# Patient Record
Sex: Female | Born: 2000 | Race: Black or African American | Hispanic: No | Marital: Single | Smoking: Never smoker
Health system: Southern US, Community
[De-identification: ages and names within clinical notes are randomized; demographics above are authoritative.]

## PROBLEM LIST (undated history)

## (undated) ENCOUNTER — Inpatient Hospital Stay (HOSPITAL_COMMUNITY): Payer: Self-pay

## (undated) DIAGNOSIS — D649 Anemia, unspecified: Secondary | ICD-10-CM

## (undated) HISTORY — PX: ILEOSTOMY: SHX1783

## (undated) HISTORY — PX: ABDOMINAL SURGERY: SHX537

---
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
## 2019-01-04 ENCOUNTER — Encounter (HOSPITAL_COMMUNITY): Payer: Self-pay | Admitting: Emergency Medicine

## 2019-01-04 ENCOUNTER — Ambulatory Visit (HOSPITAL_COMMUNITY)
Admission: EM | Admit: 2019-01-04 | Discharge: 2019-01-04 | Disposition: A | Discharge status: Home / Self Care | Payer: Medicaid Other | Attending: Urgent Care | Admitting: Urgent Care

## 2019-01-04 ENCOUNTER — Other Ambulatory Visit: Payer: Self-pay

## 2019-01-04 DIAGNOSIS — R110 Nausea: Secondary | ICD-10-CM | POA: Diagnosis present

## 2019-01-04 DIAGNOSIS — K5900 Constipation, unspecified: Secondary | ICD-10-CM

## 2019-01-04 DIAGNOSIS — R1084 Generalized abdominal pain: Secondary | ICD-10-CM | POA: Insufficient documentation

## 2019-01-04 DIAGNOSIS — D649 Anemia, unspecified: Secondary | ICD-10-CM | POA: Diagnosis present

## 2019-01-04 DIAGNOSIS — K0889 Other specified disorders of teeth and supporting structures: Secondary | ICD-10-CM | POA: Diagnosis present

## 2019-01-04 DIAGNOSIS — N939 Abnormal uterine and vaginal bleeding, unspecified: Secondary | ICD-10-CM | POA: Insufficient documentation

## 2019-01-04 LAB — CBC WITH DIFFERENTIAL/PLATELET
Abs Immature Granulocytes: 0.01 10*3/uL (ref 0.00–0.07)
Basophils Absolute: 0 10*3/uL (ref 0.0–0.1)
Basophils Relative: 1 %
Eosinophils Absolute: 0.1 10*3/uL (ref 0.0–0.5)
Eosinophils Relative: 2 %
HCT: 30.4 % — ABNORMAL LOW (ref 36.0–46.0)
Hemoglobin: 8.9 g/dL — ABNORMAL LOW (ref 12.0–15.0)
Immature Granulocytes: 0 %
Lymphocytes Relative: 36 %
Lymphs Abs: 2.6 10*3/uL (ref 0.7–4.0)
MCH: 24.5 pg — ABNORMAL LOW (ref 26.0–34.0)
MCHC: 29.3 g/dL — ABNORMAL LOW (ref 30.0–36.0)
MCV: 83.7 fL (ref 80.0–100.0)
Monocytes Absolute: 0.4 10*3/uL (ref 0.1–1.0)
Monocytes Relative: 6 %
Neutro Abs: 4 10*3/uL (ref 1.7–7.7)
Neutrophils Relative %: 55 %
Platelets: 309 10*3/uL (ref 150–400)
RBC: 3.63 MIL/uL — ABNORMAL LOW (ref 3.87–5.11)
RDW: 19.8 % — ABNORMAL HIGH (ref 11.5–15.5)
WBC: 7.2 10*3/uL (ref 4.0–10.5)
nRBC: 0 % (ref 0.0–0.2)

## 2019-01-04 LAB — POCT URINALYSIS DIP (DEVICE)
Glucose, UA: NEGATIVE mg/dL
Ketones, ur: NEGATIVE mg/dL
Nitrite: NEGATIVE
Protein, ur: 100 mg/dL — AB
Specific Gravity, Urine: 1.025 (ref 1.005–1.030)
Urobilinogen, UA: 1 mg/dL (ref 0.0–1.0)
pH: 6 (ref 5.0–8.0)

## 2019-01-04 LAB — POCT PREGNANCY, URINE: Preg Test, Ur: NEGATIVE

## 2019-01-04 MED ORDER — LIDOCAINE VISCOUS HCL 2 % MT SOLN: OROMUCOSAL | 0 refills | Status: DC

## 2019-01-04 MED ORDER — ONDANSETRON 4 MG PO TBDP
8.0000 mg | ORAL_TABLET | Freq: Once | ORAL | Status: AC
  Administered 2019-01-04: 8 mg via ORAL

## 2019-01-04 MED ORDER — ACETAMINOPHEN 325 MG PO TABS
ORAL_TABLET | ORAL | Status: AC
  Filled 2019-01-04: qty 2

## 2019-01-04 MED ORDER — ACETAMINOPHEN 325 MG PO TABS
650.0000 mg | ORAL_TABLET | Freq: Once | ORAL | Status: AC
  Administered 2019-01-04: 650 mg via ORAL

## 2019-01-04 MED ORDER — ONDANSETRON 4 MG PO TBDP
ORAL_TABLET | ORAL | Status: AC
  Filled 2019-01-04: qty 2

## 2019-01-04 MED ORDER — MEGESTROL ACETATE 40 MG PO TABS: 80.0000 mg | ORAL_TABLET | Freq: Every day | ORAL | 0 refills | Status: DC

## 2019-01-04 MED ORDER — ONDANSETRON 8 MG PO TBDP: 8.0000 mg | ORAL_TABLET | Freq: Three times a day (TID) | ORAL | 0 refills | Status: DC | PRN

## 2019-01-04 MED ORDER — DOCUSATE SODIUM 50 MG PO CAPS: 50.0000 mg | ORAL_CAPSULE | Freq: Two times a day (BID) | ORAL | 5 refills | Status: DC

## 2019-01-04 NOTE — ED Provider Notes (Addendum)
MRN: [id number] DOB: Dec 25, 2000  Subjective:   Sonya Stevens is a 18 y.o. female presenting for 2-week history of persistent nausea, 2 bouts of loose stools, intermittent moderate aching mid to lower abdominal pain.  Patient also reports having dental pain, altered sense of taste.  She has had some intermittent dizziness as well.  Of note, [facility] and found to have severe anemia with hemoglobin and hematocrit of 8.2 and 27.2%.  She had normal platelets and WBC.  Was treated with Keflex for cystitis, urine culture was equivocal.  Patient stopped taking Keflex a few days afterwards since it was not helping her symptoms.  Today she reports she has had ongoing vaginal bleeding for the past 10 days.  She has also been taking iron supplements as advised.    No Known Allergies  Past medical history of undifferentiated anemia.  History reviewed. No pertinent surgical history.   Review of Systems  Constitutional: Positive for malaise/fatigue. Negative for fever.  HENT: Negative for congestion, ear pain, sinus pain and sore throat.        Dental pain.  Eyes: Negative for blurred vision, double vision, discharge and redness.  Respiratory: Negative for cough, hemoptysis, shortness of breath and wheezing.   Cardiovascular: Negative for chest pain.  Gastrointestinal: Positive for abdominal pain, diarrhea and nausea. Negative for blood in stool and vomiting.  Genitourinary: Negative for dysuria, flank pain and hematuria.  Musculoskeletal: Negative for myalgias.  Skin: Negative for rash.  Neurological: Positive for dizziness. Negative for weakness and headaches.  Psychiatric/Behavioral: Negative for depression and substance abuse.    Objective:   Vitals: BP 103/83   Pulse 88   Temp 98.2 F (36.8 C) (Oral)   Resp 16   LMP 12/26/2018   SpO2 100%   Physical Exam Constitutional:      General: She is not in acute distress.    Appearance:  Normal appearance. She is well-developed and normal weight. She is ill-appearing. She is not toxic-appearing or diaphoretic.  HENT:     Head: Normocephalic and atraumatic.     Right Ear: Tympanic membrane, ear canal and external ear normal. No drainage or tenderness. No middle ear effusion. Tympanic membrane is not erythematous.     Left Ear: Tympanic membrane, ear canal and external ear normal. No drainage or tenderness.  No middle ear effusion. Tympanic membrane is not erythematous.     Nose: Nose normal. No congestion or rhinorrhea.     Mouth/Throat:     Mouth: Mucous membranes are moist. No oral lesions.     Pharynx: Oropharynx is clear. No pharyngeal swelling, oropharyngeal exudate, posterior oropharyngeal erythema or uvula swelling.     Tonsils: No tonsillar exudate or tonsillar abscesses.  Eyes:     General: No scleral icterus.    Extraocular Movements: Extraocular movements intact.     Right eye: Normal extraocular motion.     Left eye: Normal extraocular motion.     Conjunctiva/sclera: Conjunctivae normal.     Pupils: Pupils are equal, round, and reactive to light.  Neck:     Musculoskeletal: Normal range of motion and neck supple.  Cardiovascular:     Rate and Rhythm: Normal rate and regular rhythm.     Pulses: Normal pulses.     Heart sounds: Normal heart sounds. No murmur. No friction rub. No gallop.   Pulmonary:     Effort: Pulmonary effort is normal. No respiratory distress.     Breath sounds:  Normal breath sounds. No stridor. No wheezing, rhonchi or rales.  Abdominal:     General: Bowel sounds are normal. There is no distension.     Palpations: Abdomen is soft. There is no mass.     Tenderness: There is generalized abdominal tenderness and tenderness in the right lower quadrant, suprapubic area and left lower quadrant. There is no right CVA tenderness, left CVA tenderness, guarding or rebound. Negative signs include Murphy's sign, Rovsing's sign and McBurney's sign.   Lymphadenopathy:     Cervical: No cervical adenopathy.  Skin:    General: Skin is warm and dry.     Coloration: Skin is not pale.     Findings: No rash.  Neurological:     General: No focal deficit present.     Mental Status: She is alert and oriented to person, place, and time.  Psychiatric:        Mood and Affect: Mood normal.        Behavior: Behavior normal.        Thought Content: Thought content normal.        Judgment: Judgment normal.     Results for orders placed or performed during the hospital encounter of 01/04/19 (from the past 24 hour(s))  CBC with Differential     Status: Abnormal   Collection Time: 01/04/19 10:33 AM  Result Value Ref Range   WBC 7.2 4.0 - 10.5 K/uL   RBC 3.63 (L) 3.87 - 5.11 MIL/uL   Hemoglobin 8.9 (L) 12.0 - 15.0 g/dL   HCT 30.4 (L) 36.0 - 46.0 %   MCV 83.7 80.0 - 100.0 fL   MCH 24.5 (L) 26.0 - 34.0 pg   MCHC 29.3 (L) 30.0 - 36.0 g/dL   RDW 19.8 (H) 11.5 - 15.5 %   Platelets 309 150 - 400 K/uL   nRBC 0.0 0.0 - 0.2 %   Neutrophils Relative % 55 %   Neutro Abs 4.0 1.7 - 7.7 K/uL   Lymphocytes Relative 36 %   Lymphs Abs 2.6 0.7 - 4.0 K/uL   Monocytes Relative 6 %   Monocytes Absolute 0.4 0.1 - 1.0 K/uL   Eosinophils Relative 2 %   Eosinophils Absolute 0.1 0.0 - 0.5 K/uL   Basophils Relative 1 %   Basophils Absolute 0.0 0.0 - 0.1 K/uL   Immature Granulocytes 0 %   Abs Immature Granulocytes 0.01 0.00 - 0.07 K/uL  POCT urinalysis dip (device)     Status: Abnormal   Collection Time: 01/04/19 10:55 AM  Result Value Ref Range   Glucose, UA NEGATIVE NEGATIVE mg/dL   Bilirubin Urine SMALL (A) NEGATIVE   Ketones, ur NEGATIVE NEGATIVE mg/dL   Specific Gravity, Urine 1.025 1.005 - 1.030   Hgb urine dipstick LARGE (A) NEGATIVE   pH 6.0 5.0 - 8.0   Protein, ur 100 (A) NEGATIVE mg/dL   Urobilinogen, UA 1.0 0.0 - 1.0 mg/dL   Nitrite NEGATIVE NEGATIVE   Leukocytes,Ua TRACE (A) NEGATIVE  Pregnancy, urine POC     Status: None   Collection Time:  01/04/19 11:05 AM  Result Value Ref Range   Preg Test, Ur NEGATIVE NEGATIVE    Assessment and Plan :   1. Abnormal vaginal bleeding   2. Generalized abdominal pain   3. Nausea   4. Anemia, unspecified type   5. Pain, dental   6. Constipation, unspecified constipation type      Zofran and Tylenol given orally while in clinic.  We will have patient start  Colace to help with constipation which I suspect is due to her iron supplementation.  Will use Zofran and lidocaine for supportive care of dental pain and nausea.  Can have patient start Megace for 5 days to help with abnormal vaginal bleeding.  Emphasized need to establish care with a gynecologist and a PCP.  Patient also needs consult with dentist which she is agreeable to trying to set up. Counseled patient on potential for adverse effects with medications prescribed/recommended today, ER and return-to-clinic precautions discussed, patient verbalized understanding.    Wallis Bamberg, PA-C 01/04/19 1224    Wallis Bamberg, PA-C 01/04/19 1225

## 2019-01-04 NOTE — ED Triage Notes (Signed)
PT reports lower abdominal pain with nausea and intermittent diarrhea for 2 weeks.

## 2019-01-04 NOTE — ED Triage Notes (Signed)
Dental pain for 2 weeks as well

## 2019-01-04 NOTE — ED Triage Notes (Signed)
PT has been bleeding heavily since the 17th and has history of anemia. Reports lightheadedness with position changes

## 2019-02-14 ENCOUNTER — Ambulatory Visit (HOSPITAL_COMMUNITY)
Admission: EM | Admit: 2019-02-14 | Discharge: 2019-02-14 | Disposition: A | Discharge status: Home / Self Care | Payer: Medicaid Other | Attending: Urgent Care | Admitting: Urgent Care

## 2019-02-14 ENCOUNTER — Encounter (HOSPITAL_COMMUNITY): Payer: Self-pay

## 2019-02-14 DIAGNOSIS — Z3202 Encounter for pregnancy test, result negative: Secondary | ICD-10-CM

## 2019-02-14 DIAGNOSIS — Z20828 Contact with and (suspected) exposure to other viral communicable diseases: Secondary | ICD-10-CM | POA: Insufficient documentation

## 2019-02-14 DIAGNOSIS — R1084 Generalized abdominal pain: Secondary | ICD-10-CM | POA: Diagnosis present

## 2019-02-14 LAB — POCT URINALYSIS DIP (DEVICE)
Bilirubin Urine: NEGATIVE
Glucose, UA: NEGATIVE mg/dL
Hgb urine dipstick: NEGATIVE
Ketones, ur: NEGATIVE mg/dL
Leukocytes,Ua: NEGATIVE
Nitrite: NEGATIVE
Protein, ur: NEGATIVE mg/dL
Specific Gravity, Urine: 1.025 (ref 1.005–1.030)
Urobilinogen, UA: 1 mg/dL (ref 0.0–1.0)
pH: 7 (ref 5.0–8.0)

## 2019-02-14 LAB — POCT PREGNANCY, URINE: Preg Test, Ur: NEGATIVE

## 2019-02-14 NOTE — Discharge Instructions (Addendum)
Your urine pregnancy test was negative.  Your urine does not show signs of infection.    Your COVID test is pending.  You should self quarantine until your COVID test is back.    Go to the emergency department if you have acute abdominal pain, worsening symptoms, or new symptoms such as fever, vomiting, diarrhea, or other concerns.

## 2019-02-14 NOTE — ED Triage Notes (Signed)
Pt report having back pain, right flank pain, abdominal pain headache and nausea x 1 days.

## 2019-02-14 NOTE — ED Provider Notes (Signed)
[place]    CSN: [id number] Arrival date & time: 02/14/19  1622      History   Chief Complaint Chief Complaint  Patient presents with  . Back Pain  . Flank Pain  . Abdominal Pain  . Headache  . Nausea    HPI Sonya Stevens is a 18 y.o. female.   Patient presents with right flank pain, back pain, dysuria, lower abdominal pain, headache, nausea since yesterday.  She denies fever, vomiting, diarrhea, constipation, vaginal discharge, pelvic pain, or other symptoms.  LMP: 01/24/2019.  The history is provided by the patient.    History reviewed. No pertinent past medical history.  There are no active problems to display for this patient.   History reviewed. No pertinent surgical history.  OB History   No obstetric history on file.      Home Medications    Prior to Admission medications   Medication Sig Start Date End Date Taking? Authorizing Provider  cephALEXin (KEFLEX) 500 MG capsule Take 1 capsule (500 mg total) by mouth 4 (four) times daily. 12/07/18   Muthersbaugh, Jarrett Soho, PA-C  docusate sodium (COLACE) 50 MG capsule Take 1 capsule (50 mg total) by mouth 2 (two) times daily. 01/04/19   Jaynee Eagles, PA-C  ferrous sulfate 325 (65 FE) MG tablet Take 1 tablet (325 mg total) by mouth daily. 12/07/18   Muthersbaugh, Jarrett Soho, PA-C  lidocaine (XYLOCAINE) 2 % solution Use 15 female oral rinse 3 times daily as needed for dental pain.  Do not swallow medication. 01/04/19   Jaynee Eagles, PA-C  megestrol (MEGACE) 40 MG tablet Take 2 tablets (80 mg total) by mouth daily. 01/04/19   Jaynee Eagles, PA-C  ondansetron (ZOFRAN-ODT) 8 MG disintegrating tablet Take 1 tablet (8 mg total) by mouth every 8 (eight) hours as needed for nausea or vomiting. 01/04/19   Jaynee Eagles, PA-C    Family History Family History  Problem Relation Age of Onset  . Healthy Mother   . Healthy Father     Social History Social History   Tobacco Use  . Smoking status: Passive Smoke Exposure -  Never Smoker  . Smokeless tobacco: Never Used  Substance Use Topics  . Alcohol use: Not Currently  . Drug use: Yes    Types: Marijuana     Allergies   Patient has no known allergies.   Review of Systems Review of Systems  Constitutional: Negative for chills and fever.  HENT: Negative for congestion, ear pain, rhinorrhea and sore throat.   Eyes: Negative for pain and visual disturbance.  Respiratory: Negative for cough and shortness of breath.   Cardiovascular: Negative for chest pain and palpitations.  Gastrointestinal: Positive for abdominal pain. Negative for vomiting.  Genitourinary: Positive for dysuria and flank pain. Negative for hematuria, pelvic pain and vaginal discharge.  Musculoskeletal: Positive for back pain. Negative for arthralgias.  Skin: Negative for color change and rash.  Neurological: Positive for headaches. Negative for seizures and syncope.  All other systems reviewed and are negative.    Physical Exam Triage Vital Signs ED Triage Vitals  Enc Vitals Group     BP 02/14/19 1654 (!) 104/55     Pulse Rate 02/14/19 1654 (!) 119     Resp 02/14/19 1654 15     Temp 02/14/19 1654 98.9 F (37.2 C)     Temp Source 02/14/19 1654 Oral     SpO2 02/14/19 1654 99 %     Weight --      Height --  Head Circumference --      Peak Flow --      Pain Score 02/14/19 1652 8     Pain Loc --      Pain Edu? --      Excl. in GC? --    No data found.  Updated Vital Signs BP (!) 104/55 (BP Location: Right Arm)   Pulse (!) 119   Temp 98.9 F (37.2 C) (Oral)   Resp 15   SpO2 99%   Visual Acuity Right Eye Distance:   Left Eye Distance:   Bilateral Distance:    Right Eye Near:   Left Eye Near:    Bilateral Near:     Physical Exam Vitals signs and nursing note reviewed.  Constitutional:      General: She is not in acute distress.    Appearance: She is well-developed. She is not ill-appearing.  HENT:     Head: Normocephalic and atraumatic.     Right  Ear: Tympanic membrane normal.     Left Ear: Tympanic membrane normal.     Nose: Nose normal.     Mouth/Throat:     Mouth: Mucous membranes are moist.     Pharynx: Oropharynx is clear.  Eyes:     Conjunctiva/sclera: Conjunctivae normal.  Neck:     Musculoskeletal: Neck supple.  Cardiovascular:     Rate and Rhythm: Normal rate and regular rhythm.     Heart sounds: No murmur.  Pulmonary:     Effort: Pulmonary effort is normal. No respiratory distress.     Breath sounds: Normal breath sounds.  Abdominal:     General: Bowel sounds are normal.     Palpations: Abdomen is soft.     Tenderness: There is generalized abdominal tenderness. There is no right CVA tenderness, left CVA tenderness, guarding or rebound.  Skin:    General: Skin is warm and dry.     Findings: No rash.  Neurological:     Mental Status: She is alert.      UC Treatments / Results  Labs (all labs ordered are listed, but only abnormal results are displayed) Labs Reviewed  POCT URINALYSIS DIP (DEVICE)  POCT PREGNANCY, URINE    EKG   Radiology No results found.  Procedures Procedures (including critical care time)  Medications Ordered in UC Medications - No data to display  Initial Impression / Assessment and Plan / UC Course  I have reviewed the triage vital signs and the nursing notes.  Pertinent labs & imaging results that were available during my care of the patient were reviewed by me and considered in my medical decision making (see chart for details).   Abdominal pain, generalized.  Urine pregnancy negative.  Urine dip normal.  COVID test performed here at patient's request; instructed her to self quarantine until her test result is back.  Discussed that she should go to the emergency department if she has acute abdominal pain, has worsening symptoms, or develops new symptoms such as fever, vomiting, diarrhea, or other concerns.  Patient agrees to plan of care.     Final Clinical Impressions(s)  / UC Diagnoses   Final diagnoses:  Generalized abdominal pain     Discharge Instructions     Your urine pregnancy test was negative.  Your urine does not show signs of infection.    Your COVID test is pending.  You should self quarantine until your COVID test is back.    Go to the emergency department if you have  acute abdominal pain, worsening symptoms, or new symptoms such as fever, vomiting, diarrhea, or other concerns.        ED Prescriptions    None     PDMP not reviewed this encounter.   Mickie Bailate, Jacari Kirsten H, NP 02/14/19 1710

## 2019-02-15 LAB — NOVEL CORONAVIRUS, NAA (HOSP ORDER, SEND-OUT TO REF LAB; TAT 18-24 HRS): SARS-CoV-2, NAA: NOT DETECTED

## 2019-02-27 ENCOUNTER — Ambulatory Visit (HOSPITAL_COMMUNITY)
Admission: EM | Admit: 2019-02-27 | Discharge: 2019-02-27 | Disposition: A | Discharge status: Home / Self Care | Payer: Medicaid Other | Attending: Family Medicine | Admitting: Family Medicine

## 2019-02-27 ENCOUNTER — Encounter (HOSPITAL_COMMUNITY): Payer: Self-pay

## 2019-02-27 ENCOUNTER — Other Ambulatory Visit: Payer: Self-pay

## 2019-02-27 DIAGNOSIS — N390 Urinary tract infection, site not specified: Secondary | ICD-10-CM | POA: Insufficient documentation

## 2019-02-27 DIAGNOSIS — Z3202 Encounter for pregnancy test, result negative: Secondary | ICD-10-CM

## 2019-02-27 DIAGNOSIS — Z202 Contact with and (suspected) exposure to infections with a predominantly sexual mode of transmission: Secondary | ICD-10-CM | POA: Insufficient documentation

## 2019-02-27 DIAGNOSIS — B9689 Other specified bacterial agents as the cause of diseases classified elsewhere: Secondary | ICD-10-CM | POA: Diagnosis not present

## 2019-02-27 LAB — POCT URINALYSIS DIP (DEVICE)
Glucose, UA: 100 mg/dL — AB
Ketones, ur: 15 mg/dL — AB
Nitrite: POSITIVE — AB
Protein, ur: >=300 mg/dL — AB
Specific Gravity, Urine: 1.015 (ref 1.005–1.030)
Urobilinogen, UA: 2 mg/dL — ABNORMAL HIGH (ref 0.0–1.0)
pH: 5 (ref 5.0–8.0)

## 2019-02-27 LAB — POCT PREGNANCY, URINE: Preg Test, Ur: NEGATIVE

## 2019-02-27 MED ORDER — CEFTRIAXONE SODIUM 250 MG IJ SOLR
250.0000 mg | Freq: Once | INTRAMUSCULAR | Status: AC
  Administered 2019-02-27: 10:00:00 250 mg via INTRAMUSCULAR

## 2019-02-27 MED ORDER — SULFAMETHOXAZOLE-TRIMETHOPRIM 800-160 MG PO TABS: 1.0000 | ORAL_TABLET | Freq: Two times a day (BID) | ORAL | 0 refills | Status: AC

## 2019-02-27 MED ORDER — CEFTRIAXONE SODIUM 250 MG IJ SOLR
INTRAMUSCULAR | Status: AC
  Filled 2019-02-27: qty 250

## 2019-02-27 MED ORDER — AZITHROMYCIN 250 MG PO TABS
1000.0000 mg | ORAL_TABLET | Freq: Once | ORAL | Status: AC
  Administered 2019-02-27: 1000 mg via ORAL

## 2019-02-27 MED ORDER — LIDOCAINE HCL (PF) 1 % IJ SOLN
INTRAMUSCULAR | Status: AC
  Filled 2019-02-27: qty 2

## 2019-02-27 MED ORDER — AZITHROMYCIN 250 MG PO TABS
ORAL_TABLET | ORAL | Status: AC
  Filled 2019-02-27: qty 1

## 2019-02-27 NOTE — ED Provider Notes (Signed)
[facility]    CSN: [id number] Arrival date & time: 02/27/19  0847      History   Chief Complaint Chief Complaint  Patient presents with  . Exposure to STD  . Abdominal Pain  . Back Pain    HPI Sonya Stevens is a 18 y.o. female.   Patient is a 18 year old female that presents today with mild lower abdominal cramping, back pain x2 weeks.  She has had some mild spotting for 3 days.  Concern for STD due to sexual partner being diagnosed with chlamydia.  Denies any vaginal discharge, itching or irritation.  She has had some hematuria and mild dysuria.  History of UTI.  No fevers, chills, body aches, nausea or vomiting.  No flank pain.  She has not taken any medication for her symptoms.  ROS per HPI      History reviewed. No pertinent past medical history.  There are no active problems to display for this patient.   History reviewed. No pertinent surgical history.  OB History   No obstetric history on file.      Home Medications    Prior to Admission medications   Medication Sig Start Date End Date Taking? Authorizing Provider  docusate sodium (COLACE) 50 MG capsule Take 1 capsule (50 mg total) by mouth 2 (two) times daily. 01/04/19   Jaynee Eagles, PA-C  ferrous sulfate 325 (65 FE) MG tablet Take 1 tablet (325 mg total) by mouth daily. 12/07/18   Muthersbaugh, Jarrett Soho, PA-C  lidocaine (XYLOCAINE) 2 % solution Use 15 female oral rinse 3 times daily as needed for dental pain.  Do not swallow medication. 01/04/19   Jaynee Eagles, PA-C  megestrol (MEGACE) 40 MG tablet Take 2 tablets (80 mg total) by mouth daily. 01/04/19   Jaynee Eagles, PA-C  ondansetron (ZOFRAN-ODT) 8 MG disintegrating tablet Take 1 tablet (8 mg total) by mouth every 8 (eight) hours as needed for nausea or vomiting. 01/04/19   Jaynee Eagles, PA-C  sulfamethoxazole-trimethoprim (BACTRIM DS) 800-160 MG tablet Take 1 tablet by mouth 2 (two) times daily for 7 days. 02/27/19 03/06/19  Orvan July, NP     Family History Family History  Problem Relation Age of Onset  . Healthy Mother   . Healthy Father     Social History Social History   Tobacco Use  . Smoking status: Passive Smoke Exposure - Never Smoker  . Smokeless tobacco: Never Used  Substance Use Topics  . Alcohol use: Not Currently  . Drug use: Yes    Types: Marijuana     Allergies   Patient has no known allergies.   Review of Systems Review of Systems   Physical Exam Triage Vital Signs ED Triage Vitals  Enc Vitals Group     BP 02/27/19 0904 98/65     Pulse Rate 02/27/19 0904 84     Resp 02/27/19 0904 15     Temp 02/27/19 0904 98.5 F (36.9 C)     Temp Source 02/27/19 0904 Oral     SpO2 02/27/19 0904 100 %     Weight --      Height --      Head Circumference --      Peak Flow --      Pain Score 02/27/19 0902 8     Pain Loc --      Pain Edu? --      Excl. in [place]? --    No data found.  Updated Vital Signs BP 98/65 (  BP Location: Left Arm)   Pulse 84   Temp 98.5 F (36.9 C) (Oral)   Resp 15   SpO2 100%   Visual Acuity Right Eye Distance:   Left Eye Distance:   Bilateral Distance:    Right Eye Near:   Left Eye Near:    Bilateral Near:     Physical Exam Vitals signs and nursing note reviewed.  Constitutional:      General: She is not in acute distress.    Appearance: Normal appearance. She is not ill-appearing, toxic-appearing or diaphoretic.  HENT:     Head: Normocephalic.     Nose: Nose normal.     Mouth/Throat:     Pharynx: Oropharynx is clear.  Eyes:     Conjunctiva/sclera: Conjunctivae normal.  Neck:     Musculoskeletal: Normal range of motion.  Pulmonary:     Effort: Pulmonary effort is normal.  Abdominal:     Palpations: Abdomen is soft.     Tenderness: There is no abdominal tenderness.  Musculoskeletal: Normal range of motion.  Skin:    General: Skin is warm and dry.     Findings: No rash.  Neurological:     Mental Status: She is alert.  Psychiatric:        Mood and  Affect: Mood normal.      UC Treatments / Results  Labs (all labs ordered are listed, but only abnormal results are displayed) Labs Reviewed  POCT URINALYSIS DIP (DEVICE) - Abnormal; Notable for the following components:      Result Value   Glucose, UA 100 (*)    Bilirubin Urine LARGE (*)    Ketones, ur 15 (*)    Hgb urine dipstick LARGE (*)    Protein, ur >=300 (*)    Urobilinogen, UA 2.0 (*)    Nitrite POSITIVE (*)    Leukocytes,Ua LARGE (*)    All other components within normal limits  URINE CULTURE  POC URINE PREG, ED  POCT PREGNANCY, URINE  CERVICOVAGINAL ANCILLARY ONLY    EKG   Radiology No results found.  Procedures Procedures (including critical care time)  Medications Ordered in UC Medications  cefTRIAXone (ROCEPHIN) injection 250 mg (250 mg Intramuscular Given 02/27/19 0940)  azithromycin (ZITHROMAX) tablet 1,000 mg (1,000 mg Oral Given 02/27/19 0939)  azithromycin (ZITHROMAX) 250 MG tablet (has no administration in time range)  cefTRIAXone (ROCEPHIN) 250 MG injection (has no administration in time range)  lidocaine (PF) (XYLOCAINE) 1 % injection (has no administration in time range)    Initial Impression / Assessment and Plan / UC Course  I have reviewed the triage vital signs and the nursing notes.  Pertinent labs & imaging results that were available during my care of the patient were reviewed by me and considered in my medical decision making (see chart for details).     Exposure to STD-treating prophylactically for gonorrhea chlamydia today based on exposure.  UTI-patient positive for UTI.  Sending urine for culture.  Will treat with Bactrim Recommended pushing fluids for dehydration Return precautions given Labs pending.  Follow up as needed for continued or worsening symptoms   Final Clinical Impressions(s) / UC Diagnoses   Final diagnoses:  Lower urinary tract infectious disease  Exposure to STD     Discharge Instructions      Treat you for a urinary tract infection. Also treated you for gonorrhea and chlamydia today here in clinic. We will send her swab and urine for culture and call you with any positive  results. You need to increase your fluid intake.  You appear dehydrated on your urine. If your symptoms worsen you need to go to follow up     ED Prescriptions    Medication Sig Dispense Auth. Provider   sulfamethoxazole-trimethoprim (BACTRIM DS) 800-160 MG tablet Take 1 tablet by mouth 2 (two) times daily for 7 days. 14 tablet Rahmel Nedved A, NP     PDMP not reviewed this encounter.   Dahlia Byes A, NP 02/27/19 1002

## 2019-02-27 NOTE — ED Triage Notes (Signed)
Pt states she is having lower abdominal pain and back pain x 13 days. Pt reports she is spotting x 3 days.   Pt states her sexual partner was diagnosed with Chlamydia.Pt wants a STD test.

## 2019-02-27 NOTE — Discharge Instructions (Addendum)
Treat you for a urinary tract infection. Also treated you for gonorrhea and chlamydia today here in clinic. We will send her swab and urine for culture and call you with any positive results. You need to increase your fluid intake.  You appear dehydrated on your urine. If your symptoms worsen you need to go to follow up

## 2019-02-28 LAB — URINE CULTURE: Culture: NO GROWTH

## 2019-03-01 LAB — CERVICOVAGINAL ANCILLARY ONLY
Bacterial vaginitis: POSITIVE — AB
Candida vaginitis: NEGATIVE
Chlamydia: POSITIVE — AB
Neisseria Gonorrhea: NEGATIVE
Trichomonas: NEGATIVE

## 2019-03-02 ENCOUNTER — Encounter (HOSPITAL_COMMUNITY): Payer: Self-pay

## 2019-03-02 ENCOUNTER — Telehealth (HOSPITAL_COMMUNITY): Payer: Self-pay | Admitting: Emergency Medicine

## 2019-03-02 MED ORDER — METRONIDAZOLE 500 MG PO TABS: 500.0000 mg | ORAL_TABLET | Freq: Two times a day (BID) | ORAL | 0 refills | Status: AC

## 2019-03-02 NOTE — Telephone Encounter (Signed)
Chlamydia is positive.  This was treated at the urgent care visit with po zithromax 1g.  Pt needs education to please refrain from sexual intercourse for 7 days to give the medicine time to work.  Sexual partners need to be notified and tested/treated.  Condoms may reduce risk of reinfection.  Recheck or followup with PCP for further evaluation if symptoms are not improving.  [facility] notified.  Bacterial vaginosis is positive. This was not treated at the urgent care visit.  Flagyl 500 mg BID x 7 days #14 no refills sent to patients pharmacy of choice.    Attempted to reach patient. No answer at this time. Call cannot be completed.  Mychart message sent.

## 2019-03-03 ENCOUNTER — Telehealth (HOSPITAL_COMMUNITY): Payer: Self-pay | Admitting: Emergency Medicine

## 2019-03-03 NOTE — Telephone Encounter (Signed)
Attempted to reach patient x2. No answer at this time. Call cannot be completed.   

## 2019-03-06 ENCOUNTER — Telehealth (HOSPITAL_COMMUNITY): Payer: Self-pay | Admitting: Emergency Medicine

## 2019-03-06 NOTE — Telephone Encounter (Signed)
Attempted to reach patient x3. No answer at this time.call cannot be completed. Letter sent

## 2019-03-22 ENCOUNTER — Ambulatory Visit (HOSPITAL_COMMUNITY)
Admission: EM | Admit: 2019-03-22 | Discharge: 2019-03-22 | Disposition: A | Discharge status: Home / Self Care | Payer: Medicaid Other | Attending: Family Medicine | Admitting: Family Medicine

## 2019-03-22 ENCOUNTER — Encounter (HOSPITAL_COMMUNITY): Payer: Self-pay

## 2019-03-22 ENCOUNTER — Other Ambulatory Visit: Payer: Self-pay

## 2019-03-22 DIAGNOSIS — Z3202 Encounter for pregnancy test, result negative: Secondary | ICD-10-CM

## 2019-03-22 DIAGNOSIS — Z711 Person with feared health complaint in whom no diagnosis is made: Secondary | ICD-10-CM | POA: Insufficient documentation

## 2019-03-22 DIAGNOSIS — N898 Other specified noninflammatory disorders of vagina: Secondary | ICD-10-CM | POA: Insufficient documentation

## 2019-03-22 LAB — POCT URINALYSIS DIP (DEVICE)
Bilirubin Urine: NEGATIVE
Glucose, UA: NEGATIVE mg/dL
Ketones, ur: NEGATIVE mg/dL
Leukocytes,Ua: NEGATIVE
Nitrite: NEGATIVE
Protein, ur: NEGATIVE mg/dL
Specific Gravity, Urine: 1.015 (ref 1.005–1.030)
Urobilinogen, UA: 0.2 mg/dL (ref 0.0–1.0)
pH: 7.5 (ref 5.0–8.0)

## 2019-03-22 LAB — POCT PREGNANCY, URINE: Preg Test, Ur: NEGATIVE

## 2019-03-22 MED ORDER — AZITHROMYCIN 250 MG PO TABS: 1000.0000 mg | ORAL_TABLET | Freq: Once | ORAL | 0 refills | Status: AC

## 2019-03-22 NOTE — Discharge Instructions (Signed)
Negative for pregnancy.  We will re-treat you for chlamydia, you will have to go to the pharmacy and get this medication, unfortunately.  Please ensure all your partners have been tested and treated.  Please withhold from intercourse for the next week. Please use condoms to prevent STD's.   Will notify you of any positive findings and if any changes to treatment are needed.

## 2019-03-22 NOTE — ED Triage Notes (Signed)
Patient presents to Urgent Care with complaints of needing STD testing since having unprotected sex recently. Patient reports she has been having some vaginal itching.

## 2019-03-22 NOTE — ED Provider Notes (Signed)
[facility]    CSN: [id number] Arrival date & time: 03/22/19  1143      History   Chief Complaint Chief Complaint  Patient presents with  . SEXUALLY TRANSMITTED DISEASE    HPI Sonya Stevens is a 18 y.o. female.   Sonya Stevens presents with complaints of vaginal discharge and itching which she noted a few days ago. She is concerned about chlamydia as this feels the same. She was just treated for this 10/18, but only one of her two partners got treated. She has not always used condoms with the second partner. Symptoms started after intercourse with that partner. No vaginal bleeding. LMP was last month, she does not know when, feels like it was lighter than normal for her. Discharge is yellow. Denies sores, lesions, rash. Denies urinary symptoms.      ROS per HPI, negative if not otherwise mentioned.      History reviewed. No pertinent past medical history.  There are no active problems to display for this patient.   History reviewed. No pertinent surgical history.  OB History   No obstetric history on file.      Home Medications    Prior to Admission medications   Medication Sig Start Date End Date Taking? Authorizing Provider  azithromycin (ZITHROMAX) 250 MG tablet Take 4 tablets (1,000 mg total) by mouth once for 1 dose. Take first 2 tablets together, then 1 every day until finished. 03/22/19 03/22/19  Zigmund Gottron, NP  docusate sodium (COLACE) 50 MG capsule Take 1 capsule (50 mg total) by mouth 2 (two) times daily. 01/04/19   Jaynee Eagles, PA-C  ferrous sulfate 325 (65 FE) MG tablet Take 1 tablet (325 mg total) by mouth daily. 12/07/18   Muthersbaugh, Jarrett Soho, PA-C  lidocaine (XYLOCAINE) 2 % solution Use 15 female oral rinse 3 times daily as needed for dental pain.  Do not swallow medication. 01/04/19   Jaynee Eagles, PA-C  megestrol (MEGACE) 40 MG tablet Take 2 tablets (80 mg total) by mouth daily. 01/04/19   Jaynee Eagles, PA-C  ondansetron  (ZOFRAN-ODT) 8 MG disintegrating tablet Take 1 tablet (8 mg total) by mouth every 8 (eight) hours as needed for nausea or vomiting. 01/04/19   Jaynee Eagles, PA-C    Family History Family History  Problem Relation Age of Onset  . Healthy Mother   . Healthy Father     Social History Social History   Tobacco Use  . Smoking status: Passive Smoke Exposure - Never Smoker  . Smokeless tobacco: Never Used  Substance Use Topics  . Alcohol use: Not Currently  . Drug use: Yes    Types: Marijuana     Allergies   Patient has no known allergies.   Review of Systems Review of Systems   Physical Exam Triage Vital Signs ED Triage Vitals  Enc Vitals Group     BP 03/22/19 1215 100/64     Pulse Rate 03/22/19 1215 82     Resp 03/22/19 1215 16     Temp 03/22/19 1215 98.6 F (37 C)     Temp Source 03/22/19 1215 Oral     SpO2 03/22/19 1215 100 %     Weight --      Height --      Head Circumference --      Peak Flow --      Pain Score 03/22/19 1213 0     Pain Loc --      Pain Edu? --  Excl. in [place]? --    No data found.  Updated Vital Signs BP 100/64 (BP Location: Right Arm)   Pulse 82   Temp 98.6 F (37 C) (Oral)   Resp 16   SpO2 100%    Physical Exam Constitutional:      General: She is not in acute distress.    Appearance: She is well-developed.  Cardiovascular:     Rate and Rhythm: Normal rate.  Pulmonary:     Effort: Pulmonary effort is normal.  Abdominal:     Palpations: Abdomen is soft. Abdomen is not rigid.     Tenderness: There is no abdominal tenderness. There is no guarding or rebound.  Genitourinary:    Comments: Denies sores, lesions, vaginal bleeding; no pelvic pain; gu exam deferred at this time, vaginal self swab collected.   Skin:    General: Skin is warm and dry.  Neurological:     Mental Status: She is alert and oriented to person, place, and time.      UC Treatments / Results  Labs (all labs ordered are listed, but only abnormal results  are displayed) Labs Reviewed  POCT URINALYSIS DIP (DEVICE) - Abnormal; Notable for the following components:      Result Value   Hgb urine dipstick TRACE (*)    All other components within normal limits  POC URINE PREG, ED  POCT PREGNANCY, URINE  CERVICOVAGINAL ANCILLARY ONLY    EKG   Radiology No results found.  Procedures Procedures (including critical care time)  Medications Ordered in UC Medications - No data to display  Initial Impression / Assessment and Plan / UC Course  I have reviewed the triage vital signs and the nursing notes.  Pertinent labs & imaging results that were available during my care of the patient were reviewed by me and considered in my medical decision making (see chart for details).     Concern that all of patient's partners were not treated and then patient had unprotected intercourse again, after she was treated for chlamydia. Patient endorses same symptoms. Empiric treatment with azithromycin provided pending vaginal cytology. Safe sex encouraged. Encouraged partners to get treated. Will notify of any positive findings and if any changes to treatment are needed.  Patient verbalized understanding and agreeable to plan.   Final Clinical Impressions(s) / UC Diagnoses   Final diagnoses:  Vaginal discharge  Concern about STD in female without diagnosis     Discharge Instructions     Negative for pregnancy.  We will re-treat you for chlamydia, you will have to go to the pharmacy and get this medication, unfortunately.  Please ensure all your partners have been tested and treated.  Please withhold from intercourse for the next week. Please use condoms to prevent STD's.   Will notify you of any positive findings and if any changes to treatment are needed.     ED Prescriptions    Medication Sig Dispense Auth. Provider   azithromycin (ZITHROMAX) 250 MG tablet Take 4 tablets (1,000 mg total) by mouth once for 1 dose. Take first 2 tablets  together, then 1 every day until finished. 4 tablet Georgetta Haber, NP     PDMP not reviewed this encounter.   Georgetta Haber, NP 03/22/19 1321

## 2019-03-24 ENCOUNTER — Telehealth: Payer: Self-pay | Admitting: Emergency Medicine

## 2019-03-24 LAB — CERVICOVAGINAL ANCILLARY ONLY
Bacterial vaginitis: POSITIVE — AB
Candida vaginitis: NEGATIVE
Chlamydia: NEGATIVE
Neisseria Gonorrhea: NEGATIVE
Trichomonas: NEGATIVE

## 2019-03-24 MED ORDER — METRONIDAZOLE 500 MG PO TABS: 500.0000 mg | ORAL_TABLET | Freq: Two times a day (BID) | ORAL | 0 refills | Status: AC

## 2019-03-24 NOTE — Telephone Encounter (Signed)
Bacterial vaginosis is positive. This was not treated at the urgent care visit.  Flagyl 500 mg BID x 7 days #14 no refills sent to patients pharmacy of choice.    Attempted to reach patient. No answer at this time. Call cannot be completed.   

## 2019-09-26 ENCOUNTER — Encounter (HOSPITAL_COMMUNITY): Payer: Self-pay

## 2019-09-26 ENCOUNTER — Ambulatory Visit (HOSPITAL_COMMUNITY)
Admission: EM | Admit: 2019-09-26 | Discharge: 2019-09-26 | Disposition: A | Discharge status: Home / Self Care | Payer: Medicaid Other | Attending: Physician Assistant | Admitting: Physician Assistant

## 2019-09-26 ENCOUNTER — Other Ambulatory Visit: Payer: Self-pay

## 2019-09-26 DIAGNOSIS — Z79899 Other long term (current) drug therapy: Secondary | ICD-10-CM | POA: Insufficient documentation

## 2019-09-26 DIAGNOSIS — Z20822 Contact with and (suspected) exposure to covid-19: Secondary | ICD-10-CM | POA: Diagnosis not present

## 2019-09-26 DIAGNOSIS — R1013 Epigastric pain: Secondary | ICD-10-CM | POA: Insufficient documentation

## 2019-09-26 DIAGNOSIS — N644 Mastodynia: Secondary | ICD-10-CM | POA: Insufficient documentation

## 2019-09-26 DIAGNOSIS — A084 Viral intestinal infection, unspecified: Secondary | ICD-10-CM | POA: Diagnosis present

## 2019-09-26 DIAGNOSIS — K219 Gastro-esophageal reflux disease without esophagitis: Secondary | ICD-10-CM | POA: Diagnosis not present

## 2019-09-26 LAB — POC URINE PREG, ED: Preg Test, Ur: NEGATIVE

## 2019-09-26 MED ORDER — FAMOTIDINE 20 MG PO TABS: 20.0000 mg | ORAL_TABLET | Freq: Two times a day (BID) | ORAL | 0 refills | Status: DC

## 2019-09-26 MED ORDER — ONDANSETRON HCL 4 MG PO TABS: 4.0000 mg | ORAL_TABLET | Freq: Three times a day (TID) | ORAL | 0 refills | Status: DC | PRN

## 2019-09-26 NOTE — ED Triage Notes (Signed)
Pt c/o N/V/Dx2 days. Pt vomited 3 times and had 2 bouts of diarrhea. Pt also stated breast are sorex1 mo.

## 2019-09-26 NOTE — ED Provider Notes (Signed)
[facility]    CSN: [id number] Arrival date & time: 09/26/19  1255      History   Chief Complaint Chief Complaint  Patient presents with  . Emesis    HPI Sonya Stevens is a 19 y.o. female.   Patient presents for evaluation of 2-day history of vomiting and loose stools.  She reports she has vomited 3 times over the last 2 days.  She denies vomiting today.  She denies nausea outside of the episodes of vomiting.  She reports she has had 2 episodes of loose watery stool over the last few days.  Denies blood or dark discoloration.  She reports some abdominal cramping when feeling the need to have a bowel movement.  She denies abdominal pain outside of this.  Denies fever, chills, headache, sore throat or other cold-like symptoms.  She reports history of having issues with acid reflux.  She reports she will eat and sometimes belch up fluid that is nasty tasting.  She endorses some heartburn symptoms after eating.  She has not been treated for this.  She reports also having breast tenderness over the last month.  She reports her last menstrual cycle was last month.  She is not on birth control.  Denies nipple discharge of any kind.         History reviewed. No pertinent past medical history.  There are no problems to display for this patient.   Past Surgical History:  Procedure Laterality Date  . ABDOMINAL SURGERY      OB History   No obstetric history on file.      Home Medications    Prior to Admission medications   Medication Sig Start Date End Date Taking? Authorizing Provider  docusate sodium (COLACE) 50 MG capsule Take 1 capsule (50 mg total) by mouth 2 (two) times daily. 01/04/19   Wallis Bamberg, PA-C  famotidine (PEPCID) 20 MG tablet Take 1 tablet (20 mg total) by mouth 2 (two) times daily. 09/26/19   Tiea Manninen, Veryl Speak, PA-C  ferrous sulfate 325 (65 FE) MG tablet Take 1 tablet (325 mg total) by mouth daily. 12/07/18   Muthersbaugh, Dahlia Client, PA-C  lidocaine  (XYLOCAINE) 2 % solution Use 15 female oral rinse 3 times daily as needed for dental pain.  Do not swallow medication. 01/04/19   Wallis Bamberg, PA-C  megestrol (MEGACE) 40 MG tablet Take 2 tablets (80 mg total) by mouth daily. 01/04/19   Wallis Bamberg, PA-C  ondansetron (ZOFRAN) 4 MG tablet Take 1 tablet (4 mg total) by mouth every 8 (eight) hours as needed for nausea or vomiting. 09/26/19   Anaka Beazer, Veryl Speak, PA-C  ondansetron (ZOFRAN-ODT) 8 MG disintegrating tablet Take 1 tablet (8 mg total) by mouth every 8 (eight) hours as needed for nausea or vomiting. 01/04/19   Wallis Bamberg, PA-C    Family History Family History  Problem Relation Age of Onset  . Healthy Mother   . Healthy Father     Social History Social History   Tobacco Use  . Smoking status: Never Smoker  . Smokeless tobacco: Never Used  Substance Use Topics  . Alcohol use: Not Currently  . Drug use: Yes    Types: Marijuana     Allergies   Patient has no known allergies.   Review of Systems Review of Systems   Physical Exam Triage Vital Signs ED Triage Vitals  Enc Vitals Group     BP 09/26/19 1418 109/65     Pulse Rate 09/26/19 1418  78     Resp 09/26/19 1418 16     Temp 09/26/19 1418 98.4 F (36.9 C)     Temp Source 09/26/19 1418 Oral     SpO2 09/26/19 1418 99 %     Weight 09/26/19 1419 115 lb (52.2 kg)     Height 09/26/19 1419 5\' 2"  (1.575 m)     Head Circumference --      Peak Flow --      Pain Score 09/26/19 1419 0     Pain Loc --      Pain Edu? --      Excl. in [place]? --    No data found.  Updated Vital Signs BP 109/65   Pulse 78   Temp 98.4 F (36.9 C) (Oral)   Resp 16   Ht 5\' 2"  (1.575 m)   Wt 115 lb (52.2 kg)   SpO2 99%   BMI 21.03 kg/m   Visual Acuity Right Eye Distance:   Left Eye Distance:   Bilateral Distance:    Right Eye Near:   Left Eye Near:    Bilateral Near:     Physical Exam Vitals and nursing note reviewed.  Constitutional:      General: She is not in acute distress.     Appearance: Normal appearance. She is well-developed. She is not ill-appearing.  HENT:     Head: Normocephalic and atraumatic.     Mouth/Throat:     Mouth: Mucous membranes are moist.     Pharynx: Oropharynx is clear.  Eyes:     Conjunctiva/sclera: Conjunctivae normal.  Cardiovascular:     Rate and Rhythm: Normal rate and regular rhythm.     Heart sounds: No murmur.  Pulmonary:     Effort: Pulmonary effort is normal. No respiratory distress.     Breath sounds: Normal breath sounds.  Abdominal:     Palpations: Abdomen is soft.     Tenderness: There is no abdominal tenderness. There is no right CVA tenderness or left CVA tenderness.  Musculoskeletal:     Cervical back: Neck supple.     Right lower leg: No edema.     Left lower leg: No edema.  Skin:    General: Skin is warm and dry.  Neurological:     Mental Status: She is alert.      UC Treatments / Results  Labs (all labs ordered are listed, but only abnormal results are displayed) Labs Reviewed  SARS CORONAVIRUS 2 (TAT 6-24 HRS)  POC URINE PREG, ED    EKG   Radiology No results found.  Procedures Procedures (including critical care time)  Medications Ordered in UC Medications - No data to display  Initial Impression / Assessment and Plan / UC Course  I have reviewed the triage vital signs and the nursing notes.  Pertinent labs & imaging results that were available during my care of the patient were reviewed by me and considered in my medical decision making (see chart for details).    #Viral gastroenteritis #Dyspepsia #Breast tenderness and female Patient is a 19 year old with symptoms consistent with a viral gastroenteritis.  She also has history of reported reflux issues.  She has normal vital signs in clinic and a benign abdominal exam.  Will give short course of Zofran as needed and recommend taking Pepcid twice daily.  Recommend increase p.o. liquid intake.  Deferred breast exam today and instructed to  follow-up with her primary care for her reflux symptoms and breast tenderness. -Urine pregnancy  was negative in clinic. -Covid PCR sent. -Follow-up and return precautions were discussed.  Patient verbalized understanding of plan of care Final Clinical Impressions(s) / UC Diagnoses   Final diagnoses:  Viral gastroenteritis  Dyspepsia  Breast tenderness in female     Discharge Instructions     Use the Zofran if you start to have more nausea and vomiting otherwise hold this  Take the Pepcid twice a day  Drink plenty of water and include Pedialyte.  Eat small meals as you become hungry  Please follow-up with your primary care for reevaluation of your suspected reflux symptoms and your breast tenderness.  If your symptoms acutely worsen such you have severe belly pain, worsening vomiting or high fever please return for evaluation  If your Covid-19 test is positive, you will receive a phone call from [facility] regarding your results. Negative test results are not called. Both positive and negative results area always visible on MyChart. If you do not have a MyChart account, sign up instructions are in your discharge papers.   Persons who are directed to care for themselves at home may discontinue isolation under the following conditions:  . At least 10 days have passed since symptom onset and . At least 24 hours have passed without running a fever (this means without the use of fever-reducing medications) and . Other symptoms have improved.  Persons infected with COVID-19 who never develop symptoms may discontinue isolation and other precautions 10 days after the date of their first positive COVID-19 test.      ED Prescriptions    Medication Sig Dispense Auth. Provider   ondansetron (ZOFRAN) 4 MG tablet Take 1 tablet (4 mg total) by mouth every 8 (eight) hours as needed for nausea or vomiting. 4 tablet Butler Vegh, Marguerita Beards, PA-C   famotidine (PEPCID) 20 MG tablet Take 1 tablet (20  mg total) by mouth 2 (two) times daily. 30 tablet Daniesha Driver, Marguerita Beards, PA-C     PDMP not reviewed this encounter.   Purnell Shoemaker, PA-C 09/26/19 1943

## 2019-09-26 NOTE — Discharge Instructions (Addendum)
Use the Zofran if you start to have more nausea and vomiting otherwise hold this  Take the Pepcid twice a day  Drink plenty of water and include Pedialyte.  Eat small meals as you become hungry  Please follow-up with your primary care for reevaluation of your suspected reflux symptoms and your breast tenderness.  If your symptoms acutely worsen such you have severe belly pain, worsening vomiting or high fever please return for evaluation  If your Covid-19 test is positive, you will receive a phone call from [facility] regarding your results. Negative test results are not called. Both positive and negative results area always visible on MyChart. If you do not have a MyChart account, sign up instructions are in your discharge papers.   Persons who are directed to care for themselves at home may discontinue isolation under the following conditions:   At least 10 days have passed since symptom onset and  At least 24 hours have passed without running a fever (this means without the use of fever-reducing medications) and  Other symptoms have improved.  Persons infected with COVID-19 who never develop symptoms may discontinue isolation and other precautions 10 days after the date of their first positive COVID-19 test.

## 2019-09-27 LAB — SARS CORONAVIRUS 2 (TAT 6-24 HRS): SARS Coronavirus 2: NEGATIVE

## 2021-03-31 ENCOUNTER — Other Ambulatory Visit: Payer: Self-pay

## 2021-03-31 ENCOUNTER — Inpatient Hospital Stay (HOSPITAL_COMMUNITY)
Admission: AD | Admit: 2021-03-31 | Discharge: 2021-03-31 | Disposition: A | Discharge status: Home / Self Care | Payer: Medicaid Other | Attending: Family Medicine | Admitting: Family Medicine

## 2021-03-31 ENCOUNTER — Encounter (HOSPITAL_COMMUNITY): Payer: Self-pay | Admitting: Obstetrics and Gynecology

## 2021-03-31 ENCOUNTER — Inpatient Hospital Stay (HOSPITAL_BASED_OUTPATIENT_CLINIC_OR_DEPARTMENT_OTHER): Payer: Medicaid Other

## 2021-03-31 DIAGNOSIS — Z3A32 32 weeks gestation of pregnancy: Secondary | ICD-10-CM | POA: Insufficient documentation

## 2021-03-31 DIAGNOSIS — O4693 Antepartum hemorrhage, unspecified, third trimester: Secondary | ICD-10-CM

## 2021-03-31 DIAGNOSIS — R8290 Unspecified abnormal findings in urine: Secondary | ICD-10-CM | POA: Diagnosis not present

## 2021-03-31 DIAGNOSIS — O4703 False labor before 37 completed weeks of gestation, third trimester: Secondary | ICD-10-CM

## 2021-03-31 DIAGNOSIS — N939 Abnormal uterine and vaginal bleeding, unspecified: Secondary | ICD-10-CM

## 2021-03-31 HISTORY — DX: Anemia, unspecified: D64.9

## 2021-03-31 LAB — URINALYSIS, ROUTINE W REFLEX MICROSCOPIC
Bilirubin Urine: NEGATIVE
Glucose, UA: NEGATIVE mg/dL
Ketones, ur: NEGATIVE mg/dL
Nitrite: NEGATIVE
Protein, ur: NEGATIVE mg/dL
Specific Gravity, Urine: 1.004 — ABNORMAL LOW (ref 1.005–1.030)
pH: 7 (ref 5.0–8.0)

## 2021-03-31 LAB — WET PREP, GENITAL
Clue Cells Wet Prep HPF POC: NONE SEEN
Sperm: NONE SEEN
Trich, Wet Prep: NONE SEEN
WBC, Wet Prep HPF POC: >=10 — AB (ref <10)
Yeast Wet Prep HPF POC: NONE SEEN

## 2021-03-31 LAB — FETAL FIBRONECTIN: Fetal Fibronectin: NEGATIVE

## 2021-03-31 MED ORDER — NIFEDIPINE ER OSMOTIC RELEASE 30 MG PO TB24: 30.0000 mg | ORAL_TABLET | Freq: Every day | ORAL | 0 refills | Status: DC | PRN

## 2021-03-31 MED ORDER — NIFEDIPINE 10 MG PO CAPS
10.0000 mg | ORAL_CAPSULE | Freq: Once | ORAL | Status: AC
  Administered 2021-03-31: 10 mg via ORAL
  Filled 2021-03-31: qty 1

## 2021-03-31 MED ORDER — NIFEDIPINE 10 MG PO CAPS: 10.0000 mg | ORAL_CAPSULE | Freq: Three times a day (TID) | ORAL | Status: DC

## 2021-03-31 MED ORDER — LACTATED RINGERS IV BOLUS
1000.0000 mL | Freq: Once | INTRAVENOUS | Status: AC
  Administered 2021-03-31: 1000 mL via INTRAVENOUS

## 2021-03-31 NOTE — MAU Provider Note (Signed)
History     CSN: [id number]  Arrival date and time: 03/31/21 [id number]  Chief Complaint  Patient presents with   Vaginal Bleeding   Sonya Stevens is a 20 year old female at [redacted]w[redacted]d presenting for evaluation of vaginal spotting that started earlier today. States she noticed a pea sized amount of bright red blood on the toilet paper when she wiped this morning. No red in the toilet or on her underwear. She has not had any vaginal bleeding prior to this during pregnancy. Denies being sexually active. Endorses more white vaginal discharge without any itching or irritation, denies concern for STDs but has had in the past. Denies any leakage of fluid, constipation, known hemorrhoids, dysuria/urgency, or decreased fetal movement. No recent abdominal trauma.   She also endorses "abdominal tightening" for the past month that has gotten worse in the past week with increased frequency and intensity. "Occasional". Overall not bothersome to her.   She does not have any of her prenatal records with her, received care in [facility] and recently moved back to [facility] to stay with her mother. Reports she was told she had "a lot of fluid around the baby that might make me go into labor sooner." Otherwise endorses uncomplicated pregnancy. Taking PNV only. She has not established with any prenatal care here yet.    Past Medical History:  Diagnosis Date   Anemia     Past Surgical History:  Procedure Laterality Date   ABDOMINAL SURGERY      Family History  Problem Relation Age of Onset   Healthy Mother    Healthy Father     Social History   Tobacco Use   Smoking status: Never   Smokeless tobacco: Never  Vaping Use   Vaping Use: Never used  Substance Use Topics   Alcohol use: Not Currently   Drug use: Yes    Types: Marijuana    Comment: Last smoked March 2022    Allergies: No Known Allergies  Medications Prior to Admission  Medication Sig Dispense Refill Last Dose   docusate sodium (COLACE) 50 MG  capsule Take 1 capsule (50 mg total) by mouth 2 (two) times daily. 60 capsule 5    famotidine (PEPCID) 20 MG tablet Take 1 tablet (20 mg total) by mouth 2 (two) times daily. 30 tablet 0    ferrous sulfate 325 (65 FE) MG tablet Take 1 tablet (325 mg total) by mouth daily. 30 tablet 0    lidocaine (XYLOCAINE) 2 % solution Use 15 female oral rinse 3 times daily as needed for dental pain.  Do not swallow medication. 200 mL 0    megestrol (MEGACE) 40 MG tablet Take 2 tablets (80 mg total) by mouth daily. 10 tablet 0    ondansetron (ZOFRAN) 4 MG tablet Take 1 tablet (4 mg total) by mouth every 8 (eight) hours as needed for nausea or vomiting. 4 tablet 0    ondansetron (ZOFRAN-ODT) 8 MG disintegrating tablet Take 1 tablet (8 mg total) by mouth every 8 (eight) hours as needed for nausea or vomiting. 20 tablet 0     Review of Systems  Constitutional:  Negative for fatigue and fever.  Eyes:  Negative for visual disturbance.  Respiratory:  Negative for chest tightness and shortness of breath.   Cardiovascular:  Negative for leg swelling.  Gastrointestinal:  Negative for abdominal distention, anal bleeding, blood in stool, constipation, diarrhea and vomiting.  Genitourinary:  Positive for vaginal bleeding and vaginal discharge. Negative for decreased urine volume, dysuria and  vaginal pain.  Skin:  Negative for rash.  Neurological:  Negative for dizziness and light-headedness.  Physical Exam   Blood pressure 112/64, pulse 79, temperature 97.9 F (36.6 C), temperature source Oral, resp. rate 18, height 5\' 2"  (1.575 m), weight 61 kg, SpO2 100 %.  Physical Exam Constitutional:      General: She is not in acute distress.    Appearance: Normal appearance. She is not ill-appearing.  HENT:     Nose: Nose normal.     Mouth/Throat:     Mouth: Mucous membranes are moist.  Eyes:     Extraocular Movements: Extraocular movements intact.  Cardiovascular:     Pulses: Normal pulses.  Pulmonary:     Effort:  Pulmonary effort is normal.  Abdominal:     Palpations: Abdomen is soft.     Tenderness: There is no right CVA tenderness or left CVA tenderness.     Comments: Gravid uterus appropriate with stated gestational age. Non-tender to palpation throughout.   Genitourinary:    Comments: Pelvic exam: VULVA: normal appearing vulva with no masses, tenderness or lesions, VAGINA: normal appearing vagina with normal color with white discharge, no lesions, CERVIX: normal appearing cervix without lesions, white mucoid discharge from the os, no vaginal bleeding seen, exam chaperoned by RN.   Skin:    General: Skin is warm and dry.  Neurological:     Mental Status: She is alert and oriented to person, place, and time.  Psychiatric:        Mood and Affect: Mood normal.        Behavior: Behavior normal.    Dilation: Closed Effacement (%): Thick Exam by:: Darrelyn Hillock, DO  Small amount of blood on finger with exam.   NST:  Baseline: 140bpm  Variability: Moderate  Accel: Several 15x15  Decel: None  Toco: Contractions occurring 4-10 minutes apart   MAU Course   MDM UA  FFN, IV fluid bolus, procardia series  [place] limited for placenta/vessel location  Wet prep, gc/ch  Sterile speculum exam  Reevaluated after receiving IV fluid bolus.  Still having some occasional abdominal tightening that is nonpainful/non-bothersome, not always associated with contractions seen on toco.  Reactive NST. Cervix closed at internal os. Discussed with Dr. Nehemiah Settle, will give Procardia series and discharged home with Procardia.  Given Procardia X2.  Tightening has resolved. No contractions seen on toco now.   Assessment and Plan  1. Vaginal bleeding in pregnancy, third trimester Spotting only, likely in the setting of cervical friability. Wet prep negative, gc/ch pending. Clear speculum exam.  Placenta posterior without previa on [place].  No recent trauma or clinical concern for placental abruption, not seen on U/S.   Provided reassurance.  MAU precautions if worsens.  2. [redacted] weeks gestation of pregnancy Vigorous fetal movement.  Reactive NST.  Sent message to [facility] to schedule for prenatal care, last seen at 28 weeks.   3. Preterm uterine contractions in third trimester, antepartum  Occurring for the past several weeks per patient.  Fetal fibronectin negative.  Cervix closed.  Resolved after Procardia series/IV fluids.  Rx'd Procardia XL 30 mg as needed for contractions.  Will establish care with [facility] for follow-up.  4. Abnormal UA Many bacteria with leukocytes, nitrate negative and several squamous epithelium.  No urinary symptoms.  Sent for culture, treat if positive.  MAU precautions strictly discussed, especially worsening of vaginal bleeding or increase in severity/frequency of abdominal tightening.  Preterm labor precautions discussed.   Sonya Stevens 03/31/2021,  10:22 AM

## 2021-03-31 NOTE — Discharge Instructions (Signed)
It was wonderful to see you!   Your cervix can come more friable (easily irritated) as you continue in your pregnancy which can cause some spotting. If this bleeding becomes worse and requires you to place a pad in your underwear to catch continued bleeding, please to be seen again.   Additionally, I am sending you a medication called procardia. This is to use as needed (once daily at most) when you feel the abdominal tightening. If despite this, the tightening is getting worse/more painful and more frequent, please be seen in the MAU. Lastly, any leakage of fluid or decreased fetal movement you can be seen here as well.   I have sent a message to [facility] for you to establish care. They should call you in the next 2 days, if you do not hear a call from them, please call the office to schedule.

## 2021-03-31 NOTE — MAU Note (Signed)
Presents stating she noticed VB with wiping, denies heavy VB similar to a menstrual cycle.  Denies LOF.  Also states she's having BH ctxs, stomach gets tight.  Endorses +FM. Reports last received PNC at [redacted] wks gestation in [facility].  Hasn't established care in [place], states she's been trying without success.

## 2021-04-01 LAB — GC/CHLAMYDIA PROBE AMP (~~LOC~~) NOT AT ARMC
Chlamydia: NEGATIVE
Comment: NEGATIVE
Comment: NORMAL
Neisseria Gonorrhea: NEGATIVE

## 2021-04-01 LAB — CULTURE, OB URINE: Culture: NO GROWTH

## 2021-04-15 ENCOUNTER — Inpatient Hospital Stay (HOSPITAL_COMMUNITY)
Admission: EM | Admit: 2021-04-15 | Discharge: 2021-04-15 | Disposition: A | Discharge status: Home / Self Care | Payer: Medicaid Other | Attending: Family Medicine | Admitting: Family Medicine

## 2021-04-15 ENCOUNTER — Other Ambulatory Visit: Payer: Self-pay

## 2021-04-15 ENCOUNTER — Encounter (HOSPITAL_COMMUNITY): Payer: Self-pay

## 2021-04-15 DIAGNOSIS — O26893 Other specified pregnancy related conditions, third trimester: Secondary | ICD-10-CM | POA: Insufficient documentation

## 2021-04-15 DIAGNOSIS — Z3A34 34 weeks gestation of pregnancy: Secondary | ICD-10-CM | POA: Diagnosis not present

## 2021-04-15 DIAGNOSIS — R109 Unspecified abdominal pain: Secondary | ICD-10-CM | POA: Diagnosis not present

## 2021-04-15 DIAGNOSIS — O4703 False labor before 37 completed weeks of gestation, third trimester: Secondary | ICD-10-CM | POA: Diagnosis not present

## 2021-04-15 DIAGNOSIS — N898 Other specified noninflammatory disorders of vagina: Secondary | ICD-10-CM | POA: Diagnosis not present

## 2021-04-15 DIAGNOSIS — Z3689 Encounter for other specified antenatal screening: Secondary | ICD-10-CM

## 2021-04-15 LAB — URINALYSIS, ROUTINE W REFLEX MICROSCOPIC
Bilirubin Urine: NEGATIVE
Glucose, UA: NEGATIVE mg/dL
Hgb urine dipstick: NEGATIVE
Ketones, ur: NEGATIVE mg/dL
Leukocytes,Ua: NEGATIVE
Nitrite: NEGATIVE
Protein, ur: NEGATIVE mg/dL
Specific Gravity, Urine: 1.01 (ref 1.005–1.030)
pH: 7 (ref 5.0–8.0)

## 2021-04-15 MED ORDER — LACTATED RINGERS IV BOLUS
1000.0000 mL | Freq: Once | INTRAVENOUS | Status: AC
  Administered 2021-04-15: 1000 mL via INTRAVENOUS

## 2021-04-15 MED ORDER — ACETAMINOPHEN 500 MG PO TABS
1000.0000 mg | ORAL_TABLET | Freq: Once | ORAL | Status: AC
  Administered 2021-04-15: 1000 mg via ORAL
  Filled 2021-04-15: qty 2

## 2021-04-15 MED ORDER — NIFEDIPINE 10 MG PO CAPS
10.0000 mg | ORAL_CAPSULE | ORAL | Status: DC | PRN
  Administered 2021-04-15: 10 mg via ORAL
  Filled 2021-04-15 (×2): qty 1

## 2021-04-15 MED ORDER — CYCLOBENZAPRINE HCL 5 MG PO TABS
10.0000 mg | ORAL_TABLET | Freq: Once | ORAL | Status: AC
  Administered 2021-04-15: 10 mg via ORAL
  Filled 2021-04-15: qty 2

## 2021-04-15 NOTE — ED Provider Notes (Signed)
Emergency Medicine Provider OB Triage Evaluation Note  Sonya Stevens is a 20 y.o. female, G1P0, at [redacted]w[redacted]d gestation who presents to the emergency department with complaints of abd cramping since yesterday seems worse since 5am.  Review of  Systems  Positive: cramping Negative: fever  Physical Exam  BP 126/86 (BP Location: Right Arm)   Pulse 91   Temp 98.1 F (36.7 C) (Oral)   Resp 14   SpO2 100%  General: Awake, no distress  HEENT: Atraumatic  Resp: Normal effort  Cardiac: Normal rate Abd: Gravid abdomen. NTTP MSK: Moves all extremities without difficulty Neuro: Speech clear  Medical Decision Making  Pt evaluated for pregnancy concern and is stable for transfer to MAU. Pt is in agreement with plan for transfer.  11:31 AM Discussed with MAU APP  Clinical Impression  No diagnosis found.  Discussed with MAU who requests OBRR nurse assessment. Discussed with Denny Peon who will assess and call MAU.    Gailen Shelter, [place] 04/15/21 1136    Terald Sleeper, MD 04/15/21 [phone]

## 2021-04-15 NOTE — MAU Note (Signed)
Sonya Stevens is a 20 y.o. at [redacted]w[redacted]d here in MAU reporting: ctx since 0500 this morning. States she feels the urge to have a bowel movement. Having some spotting and white discharge. Endorses good fetal movement.    Pain score: 8 Vitals:   04/15/21 1127 04/15/21 1208  BP: 126/86 116/72  Pulse: 91 72  Resp: 14 15  Temp: 98.1 F (36.7 C) 98.2 F (36.8 C)  SpO2: 100% 100%     Lab orders placed from triage:  UA

## 2021-04-15 NOTE — ED Triage Notes (Signed)
Patient is [redacted] weeks pregnant and developed contractions with fluid leakage at 0500. Patient is G1, P0. Patient alert and oriented.  Describes as intermittent abdominal discomfort.

## 2021-04-15 NOTE — MAU Provider Note (Signed)
History     CSN: [id number]  Arrival date and time: 04/15/21 1121   Event Date/Time   First Provider Initiated Contact with Patient 04/15/21 1505      Chief Complaint  Patient presents with   Contractions   HPI Sonya Stevens is a 20 y.o. G1P0 at [redacted]w[redacted]d who presents to MAU from [facility] with chief complaint of preterm contractions. This is a recurrent problem. Current episode began at 0500 today. Pain score is 8/10 and accompanied by urge to defecate. She also reports light spotting and white vaginal discharge. She denies overt vaginal bleeding, leaking of fluid, decreased fetal movement, fever, falls, or recent illness. Patient has an active prescription for Procardia 30 XL for preterm contractions but has not picked it up from the pharmacy. She denies painful contractions, leaking of fluid, decreased fetal movement, fever, falls, or recent illness. She is remote from sexual intercourse.  Patient is in the process of transferring prenatal care from [place] to [facility]. She has her first appointment at [facility] on 04/18/2021.  OB History     Gravida  1   Para      Term      Preterm      AB      Living         SAB      IAB      Ectopic      Multiple      Live Births              Past Medical History:  Diagnosis Date   Anemia     Past Surgical History:  Procedure Laterality Date   ABDOMINAL SURGERY      Family History  Problem Relation Age of Onset   Healthy Mother    Healthy Father     Social History   Tobacco Use   Smoking status: Never   Smokeless tobacco: Never  Vaping Use   Vaping Use: Never used  Substance Use Topics   Alcohol use: Not Currently   Drug use: Yes    Types: Marijuana    Comment: Last smoked March 2022    Allergies: No Known Allergies  Medications Prior to Admission  Medication Sig Dispense Refill Last Dose   docusate sodium (COLACE) 50 MG capsule Take 1 capsule (50 mg total) by mouth 2 (two) times daily. 60 capsule 5     famotidine (PEPCID) 20 MG tablet Take 1 tablet (20 mg total) by mouth 2 (two) times daily. 30 tablet 0    ferrous sulfate 325 (65 FE) MG tablet Take 1 tablet (325 mg total) by mouth daily. 30 tablet 0    lidocaine (XYLOCAINE) 2 % solution Use 15 female oral rinse 3 times daily as needed for dental pain.  Do not swallow medication. 200 mL 0    megestrol (MEGACE) 40 MG tablet Take 2 tablets (80 mg total) by mouth daily. 10 tablet 0    NIFEdipine (PROCARDIA-XL/NIFEDICAL-XL) 30 MG 24 hr tablet Take 1 tablet (30 mg total) by mouth daily as needed (contractions). 30 tablet 0     Review of Systems  Gastrointestinal:  Positive for abdominal pain.  Genitourinary:  Positive for vaginal discharge.  All other systems reviewed and are negative. Physical Exam   Blood pressure 116/72, pulse 72, temperature 98.2 F (36.8 C), temperature source Oral, resp. rate 15, SpO2 100 %.  Physical Exam Vitals and nursing note reviewed. Exam conducted with a chaperone present.  Constitutional:  Appearance: Normal appearance. She is not ill-appearing.  Cardiovascular:     Rate and Rhythm: Normal rate and regular rhythm.     Pulses: Normal pulses.     Heart sounds: Normal heart sounds.  Pulmonary:     Effort: Pulmonary effort is normal.     Breath sounds: Normal breath sounds.  Abdominal:     Tenderness: There is no right CVA tenderness or left CVA tenderness.     Comments: Gravid  Genitourinary:    Comments: Pelvic exam: External genitalia normal, vaginal walls pink and well rugated, cervix visually closed, no lesions noted. Closed cervix confirmed with sterile digital exam.   Skin:    Capillary Refill: Capillary refill takes less than 2 seconds.  Neurological:     Mental Status: She is alert and oriented to person, place, and time.  Psychiatric:        Mood and Affect: Mood normal.        Behavior: Behavior normal.        Thought Content: Thought content normal.        Judgment: Judgment normal.     MAU Course/MDM  Procedures: sterile speculum exam  --EMR reviewed. MAU visit for similar complaints on 03/31/2021. Negative FFN during that encounter --No bleeding or abnormal discharge visualized on SSE. Negative pooling, negative fern, negative gush with Valsalva --Patient initially declined IV fluid bolus. Verbalized preference for PO medication and reevaluation. --Patient sleeping when CNM returned to room at 1415 --Reactive tracing: baseline 135, mod var, + accels, no decels --Toco: initially ctx q 6-10 min, resolved to UI with treatments given in MAU --Patient declines recheck of cervix s/p 3 hours of monitoring in MAU  Patient Vitals for the past 24 hrs:  BP Temp Temp src Pulse Resp SpO2  04/15/21 1434 110/66 -- -- 91 15 --  04/15/21 1208 116/72 98.2 F (36.8 C) Oral 72 15 100 %  04/15/21 1127 126/86 98.1 F (36.7 C) Oral 91 14 100 %   Results for orders placed or performed during the hospital encounter of 04/15/21 (from the past 24 hour(s))  Urinalysis, Routine w reflex microscopic Urine, Clean Catch     Status: None   Collection Time: 04/15/21 12:55 PM  Result Value Ref Range   Color, Urine YELLOW YELLOW   APPearance CLEAR CLEAR   Specific Gravity, Urine 1.010 1.005 - 1.030   pH 7.0 5.0 - 8.0   Glucose, UA NEGATIVE NEGATIVE mg/dL   Hgb urine dipstick NEGATIVE NEGATIVE   Bilirubin Urine NEGATIVE NEGATIVE   Ketones, ur NEGATIVE NEGATIVE mg/dL   Protein, ur NEGATIVE NEGATIVE mg/dL   Nitrite NEGATIVE NEGATIVE   Leukocytes,Ua NEGATIVE NEGATIVE   Meds ordered this encounter  Medications   acetaminophen (TYLENOL) tablet 1,000 mg   cyclobenzaprine (FLEXERIL) tablet 10 mg   lactated ringers bolus 1,000 mL   NIFEdipine (PROCARDIA) capsule 10 mg   Assessment and Plan  --20 y.o. G1P0 at [redacted]w[redacted]d  --Preterm contractions --Closed cervix --Intact amniotic sac --Reactive tracing --Pain score 0/10 prior to discharge --Discharge home in stable condition  Calvert Cantor, CNM 04/15/2021, 5:03 PM

## 2021-04-15 NOTE — Progress Notes (Signed)
FHR dopplered at 132bpm, transported with rrob via wheelchair to mau

## 2021-04-18 ENCOUNTER — Other Ambulatory Visit: Payer: Self-pay

## 2021-04-18 ENCOUNTER — Ambulatory Visit (INDEPENDENT_AMBULATORY_CARE_PROVIDER_SITE_OTHER): Payer: Medicaid Other

## 2021-04-18 DIAGNOSIS — Z3403 Encounter for supervision of normal first pregnancy, third trimester: Secondary | ICD-10-CM | POA: Insufficient documentation

## 2021-04-18 DIAGNOSIS — O0930 Supervision of pregnancy with insufficient antenatal care, unspecified trimester: Secondary | ICD-10-CM | POA: Insufficient documentation

## 2021-04-18 MED ORDER — BLOOD PRESSURE KIT DEVI: 1.0000 | 0 refills | Status: DC

## 2021-04-18 MED ORDER — PRENATAL VITAMINS 28-0.8 MG PO TABS: 1.0000 | ORAL_TABLET | Freq: Every day | ORAL | 11 refills | Status: DC

## 2021-04-18 NOTE — Patient Instructions (Signed)
AREA PEDIATRIC/FAMILY PRACTICE PHYSICIANS  Central/[place]outheast [place] ([address]) [facility] Chambliss, MD; Eniola, MD; Hale, MD; Hensel, MD; McDiarmid, MD; McIntyer, MD; Neal, MD; Walden, MD [address] [place], [place] [address] [phone] Mon-Fri 8:30-12:30, 1:30-5:00 Providers come to see babies at [facility] Accepting Medicaid [facility] providers who accept newborns: Koirala, MD; Morrow, MD; Wolters, MD [address], [place], [place] [address] [phone] Mon-Fri 8:00-5:30 Babies seen by providers at [facility] Does NOT accept Medicaid Please call early in hospitalization for appointment (limited availability)  [facility] Mulberry, MD [address] [place], [place] [address] [phone] Mon, Tue, Thur, Fri 8:30-5:00, Wed 10:00-7:00 (closed 1-2pm) Babies seen by [facility] providers Accepting Medicaid Rubin - Pediatrician Rubin, MD [address], [place], [place] [address] [phone] Mon-Fri 8:30-5:00, Sat 8:30-12:00 Provider comes to see babies at [facility] Accepting Medicaid Must have been referred from current patients or contacted office prior to delivery [facility] ([facility]) Brown, MD; Chandler, MD; Ettefagh, MD; Grant, MD; Lester, MD; McCormick, MD; McQueen, MD; Prose, MD; Simha, MD; Stanley, MD; Stryffeler, NP; Tebben, NP [address], [place], [place] [address] [phone] Mon, Tue, Thur, Fri 8:30-5:30, Wed 9:30-5:30, Sat 8:30-12:30 Babies seen by [facility] providers Accepting Medicaid Only accepting infants of first-time parents or siblings of current patients Hospital discharge coordinator will make follow-up appointment Sonya Stevens [address], [place], [place]  [address] [phone]   Fax - [phone] [facility] [address], [place], [place]  [address] Phone - [phone]   Fax - [phone] Shilpa Gosrani [address], [place], [place]  [address] [phone]  [place]heast [place] ([address]) [facility] Bates, MD; Brassfield, MD; Cooper, Cox, MD; MD; Davis, MD; Dovico, MD; Ettefaugh, MD; Little, MD; Lowe, MD; Keiffer, MD; Melvin, MD; Sumner, MD; Williams, MD [address], [place], [place] [address] [phone] Mon-Fri 8:30-5:00 (extended evenings Mon-Thur as needed), Sat-Sun 10:00-1:00 Providers come to see babies at [facility] Accepting Medicaid for families of first-time babies and families with all children in the household age 3 and under. Must register with office prior to making appointment (M-F only). [facility] Henson, NP; Knapp, MD; Lalonde, MD; Tysinger, PA [address] [place], [place] [address] [phone] Mon-Fri 8:00-5:00 Babies seen by providers at [facility] Does NOT accept Medicaid/Commercial Insurance Only [facility] ([facility])  Artis, MD; Barnes, MD; Bratton, MD; Coccaro, MD; Lockett Gardner, MD; Kramer, MD; Marshall, MD; Netherton, MD; Poleto, MD; Skinner, MD [address] [place], [place] [address] [phone] Mon-Fri 8:30-5:30, Sat (Oct.-Mar.) 9:00-1:00 Babies seen by providers at [facility] Accepting Medicaid  [place] ([address]) [facility] Reid, MD; Warner, MD [address], [place], [place] [address] [phone] Mon-Fri 8:30-5:00, Sat 8:30-12:00 Providers come to see babies at [facility] Does NOT accept Medicaid [facility] Becker, PA; Hagler, MD; Scifres, PA; Sun, MD; Swayne, MD [address], [place], [place] [address] [phone] Mon-Fri 8:00-5:00 Babies seen by providers at [facility] Does NOT accept Medicaid Only accepting babies of parents who  are patients Please call early in hospitalization for appointment (limited availability) [facility] Clark, MD; Frye, MD; Kelleher, MD; Mack, NP; Miller, MD; O'Keller, MD; Patterson, NP; Pudlo, MD; Puzio, MD; Thomas, MD; Tucker, MD; Twiselton, MD [address], [place], [place] [address] [phone] Mon-Fri 8:00-5:00, Sat 9:00-12:00 Providers come to see babies at [facility] Does NOT accept Medicaid  [place] ([address]) [facility] providers accepting new patients: Brake, NP; Wharton, PA [address], [place], [place] [address] [phone] Mon-Fri 8:00-5:00 Babies seen by providers at [facility] Does NOT accept Medicaid Only accepting babies of parents who are patients Please call early in hospitalization for appointment (limited availability) [facility] Gay, MD; Quinlan, MD [address] [place], [place] [address] [phone] (press [phone] to schedule appointment) Mon-Fri 8:00-5:00 Providers come to see babies at [facility] Does NOT accept Medicaid [facility] Mazer, MD [address] [place], [place] [address] [phone] Mon-Fri 8:30-5:00 (lunch 12:30-1:00), extended hours by appointment only Wed 5:00-6:30 Babies seen by [facility] providers Accepting Medicaid [facility] Banks, MD; Jordan, MD; Koberlein, MD [address], [place], [place] [address] [phone] Mon-Fri 8:00-5:00 Babies seen by [facility] providers Does NOT accept Medicaid [facility] Parker, MD; Hunter, MD; Wallace, DO [address] [place], [place] [address] [phone] Mon-Fri 8:00-5:00 Babies seen by [facility] providers Does NOT accept Medicaid [facility] Brandon, PA; Brecken, PA; Christy, NP; Dees, MD; DeClaire, MD; DeWeese, MD; Hansen, NP; Mills, NP; Parrish, NP; Smoot, NP; Summer, MD; Vapne,  MD [address] [place], [place] [address] [phone] Mon-Fri 8:30-5:00, Sat 10:00-1:00 Providers come to see babies at [facility] Does NOT accept Medicaid Free prenatal information session Tuesdays at 4:45pm [facility] Bouska, MD; Gordon, PA; Jeffery, PA; Weber, PA [address] [place] [address] [phone] Mon-Fri 7:30-5:30 Babies seen by [facility] providers [facility] [address], [place], [place]  [address] [phone]   Fax - [phone]  [place] ([address] & [address]) [facility] [place] [address] [place], [place] [address] [phone] Mon-Thur 8:00-6:00 Providers come to see babies at [facility] Accepting Medicaid [facility] Anderson, NP; Badger, MD; Beal, PA; Spencer, PA [address] [place], [place] [address] [phone] Mon-Thur 7:30-7:30, Fri 7:30-4:30 Babies seen by [facility] providers Accepting Medicaid [facility] Agbuya, MD; Klett, NP; Romgoolam, MD [address], [place], [place] [address] [phone] Mon-Fri 8:30-5:00, Sat 8:30-12:00 Providers come to see babies at [facility] Accepting Medicaid Must have "Meet & Greet" appointment at office prior to delivery [facility] ([facility] McCord, MD; Wallace, MD; Wood, MD [address], [place], [place] [address] [phone] Mon-Wed 8:00-6:00, Thur-Fri 8:00-5:00, Sat 9:00-12:00 Providers come to see babies at [facility] Does NOT accept Medicaid Only accepting siblings of current patients [facility]  [address], [place], [place]  [address] [phone]   Fax - [phone] [facility] [address], [place], [place]  [address] [phone]   Fax -  [phone]  [place] ([address] & [address]) [facility] Cirigliano, DO; Matthews, DO [address] [place], [place] [address] [phone] Mon-Fri 7:00-5:00 Babies seen by [facility] providers Does NOT accept Medicaid [facility] Briscoe, MD; Howley, PA; Moreira, PA [address], [place],  [address] [phone] Mon-Fri 8:00-5:00 Babies seen by [facility] providers Accepting Medicaid [facility] Boyd, MD; Church, PA; Jones, NP; Osborn, PA [address], ,  [address] [phone] Mon-Fri 8:00-5:00 Babies seen by providers at [facility] Accepting Medicaid  [place] ([address]) [facility] [place] Wendling, DO [address] [place], [place] [address] [phone] Mon-Fri 8:00-5:00 Babies seen by [facility] providers Does NOT accept Medicaid Limited availability, please call early in hospitalization to schedule follow-up [facility] Calderon, PA; Cummings, MD; Dillard, MD; Martin, PA; Olson, MD; VanDeven, PA [address], [place], [place] [address] [phone] Mon-Fri 8:30-5:00, Sat 9:00-12:00 Babies seen by providers at [facility] Accepting Medicaid Please register online then schedule online or call office [email or web address] Hunter, NP; Kumar, MD; Martin Rogers, PA [address], [place], [place] [address] [phone] Mon-Fri 8:00-5:00 Babies seen by providers at [facility] Accepting Medicaid [facility] ([facility]) [place], MD; Kristi Fleenor, NP; West, MD [address], [place], [place] [address] [phone] Mon-Fri 8:00-5:30, Sat&Sun by appointment (phones open at  8:30) Babies seen by [facility] providers Accepting Medicaid Must be a first-time baby or sibling of current patient [facility] [place]  [address], [place], [place]  [address] [phone]   Fax - [phone]     

## 2021-04-18 NOTE — Progress Notes (Signed)
Patient ultrasound scheduled for 05/08/21 at 10:30 AM. Patient notified.

## 2021-04-18 NOTE — Progress Notes (Signed)
Subjective:   Sonya Stevens is a 20 y.o. G1P0 at [redacted]w[redacted]d by LMP being seen today for her first obstetrical visit. Her obstetrical history is significant for  limited prenatal care  and has Encounter for supervision of normal first pregnancy in third trimester and Limited prenatal care, antepartum on their problem list.. Patient does intend to breast feed. Pregnancy history fully reviewed.  Patient reports no complaints. Patient recently moved from [facility], reports she was told "baby had a lot of fluid" but cannot remember how far along she was. Thinks around 23 or 28 weeks. She did not have GTT done prior moving. She denies any other problems with this pregnancy. She denies contractions, bleeding, or leaking fluid. Endorses active fetal movement.   HISTORY: OB History  Gravida Para Term Preterm AB Living  1 0 0 0 0 0  SAB IAB Ectopic Multiple Live Births  0 0 0 0 0    # Outcome Date GA Lbr Len/2nd Weight Sex Delivery Anes PTL Lv  1 Current            Past Medical History:  Diagnosis Date   Anemia    Past Surgical History:  Procedure Laterality Date   ABDOMINAL SURGERY     Family History  Problem Relation Age of Onset   Healthy Mother    Healthy Father    Social History   Tobacco Use   Smoking status: Never   Smokeless tobacco: Never  Vaping Use   Vaping Use: Never used  Substance Use Topics   Alcohol use: Not Currently   Drug use: Not Currently    Types: Marijuana    Comment: Last smoked March 2022   No Known Allergies Current Outpatient Medications on File Prior to Visit  Medication Sig Dispense Refill   docusate sodium (COLACE) 50 MG capsule Take 1 capsule (50 mg total) by mouth 2 (two) times daily. 60 capsule 5   famotidine (PEPCID) 20 MG tablet Take 1 tablet (20 mg total) by mouth 2 (two) times daily. (Patient not taking: Reported on 04/18/2021) 30 tablet 0   ferrous sulfate 325 (65 FE) MG tablet Take 1 tablet (325 mg total) by mouth daily. 30 tablet 0    NIFEdipine (PROCARDIA-XL/NIFEDICAL-XL) 30 MG 24 hr tablet Take 1 tablet (30 mg total) by mouth daily as needed (contractions). (Patient not taking: Reported on 04/18/2021) 30 tablet 0   No current facility-administered medications on file prior to visit.   Exam   Vitals:   04/18/21 1045  BP: 102/71  Pulse: 70  Weight: 139 lb 11.2 oz (63.4 kg)   Fetal Heart Rate (bpm): 160  Uterus:  Fundal Height: 35 cm  Pelvic Exam: Perineum: deferred   Vulva: deferred   Vagina:  deferred   Cervix: deferred   Adnexa: deferred   Bony Pelvis: deferred  System: General: well-developed, well-nourished female in no acute distress   Breast:  normal appearance, no masses or tenderness   Skin: normal coloration and turgor, no rashes   Neurologic: oriented, normal, negative, normal mood   Extremities: normal strength, tone, and muscle mass, ROM of all joints is normal   HEENT PERRLA, extraocular movement intact and sclera clear, anicteric   Mouth/Teeth mucous membranes moist, pharynx normal without lesions and dental hygiene good   Neck supple and no masses   Cardiovascular: regular rate and rhythm   Respiratory:  no respiratory distress, normal breath sounds   Abdomen: soft, non-tender; bowel sounds normal; no masses,  no  organomegaly     Assessment:   Pregnancy: G1P0 Patient Active Problem List   Diagnosis Date Noted   Encounter for supervision of normal first pregnancy in third trimester 04/18/2021   Limited prenatal care, antepartum 04/18/2021     Plan:  1. Encounter for supervision of normal first pregnancy in third trimester - NOB, no concerns - No records from [facility]. Release of information signed - Patient did not have GTT, not fasting. A1C collected  - [facility] AMB BABYSCRIPTS SCHEDULE OPTIMIZATION - Culture, OB Urine - Genetic Screening - CBC/D/Plt+RPR+Rh+ABO+RubIgG... - Hemoglobin A1c - HIV Antibody (routine testing w rflx) - [place] MFM OB DETAIL +14 WK; Future  2. Limited prenatal care,  antepartum - No prenatal care since approximately 28 weeks per patient   Initial labs drawn. Continue prenatal vitamins. Discussed and offered genetic screening options, including Quad screen/AFP, NIPS testing, and option to decline testing. Benefits/risks/alternatives reviewed. Pt aware that anatomy US is form of genetic screening with lower accuracy in detecting trisomies than blood work.  Pt chooses/declines genetic screening today. NIPS: requested. Ultrasound discussed; fetal anatomic survey: ordered. Problem list reviewed and updated. The nature of [facility] with multiple MDs and other Advanced Practice Providers was explained to patient; also emphasized that residents, students are part of our team. Routine obstetric precautions reviewed. Return in about 10 days (around 04/28/2021).    Brand Males, CNM 04/18/21 7:52 PM

## 2021-04-19 LAB — CBC/D/PLT+RPR+RH+ABO+RUBIGG...
Antibody Screen: NEGATIVE
Basophils Absolute: 0 10*3/uL (ref 0.0–0.2)
Basos: 0 %
EOS (ABSOLUTE): 0.2 10*3/uL (ref 0.0–0.4)
Eos: 2 %
HCV Ab: 0.1 s/co ratio (ref 0.0–0.9)
HIV Screen 4th Generation wRfx: NONREACTIVE
Hematocrit: 27.1 % — ABNORMAL LOW (ref 34.0–46.6)
Hemoglobin: 9 g/dL — ABNORMAL LOW (ref 11.1–15.9)
Hepatitis B Surface Ag: NEGATIVE
Immature Grans (Abs): 0.1 10*3/uL (ref 0.0–0.1)
Immature Granulocytes: 1 %
Lymphocytes Absolute: 1.5 10*3/uL (ref 0.7–3.1)
Lymphs: 22 %
MCH: 28.9 pg (ref 26.6–33.0)
MCHC: 33.2 g/dL (ref 31.5–35.7)
MCV: 87 fL (ref 79–97)
Monocytes Absolute: 0.8 10*3/uL (ref 0.1–0.9)
Monocytes: 11 %
Neutrophils Absolute: 4.5 10*3/uL (ref 1.4–7.0)
Neutrophils: 64 %
Platelets: 247 10*3/uL (ref 150–450)
RBC: 3.11 x10E6/uL — ABNORMAL LOW (ref 3.77–5.28)
RDW: 11.4 % — ABNORMAL LOW (ref 11.7–15.4)
RPR Ser Ql: NONREACTIVE
Rh Factor: POSITIVE
Rubella Antibodies, IGG: 4.51 index (ref >0.99)
WBC: 7.1 10*3/uL (ref 3.4–10.8)

## 2021-04-19 LAB — HEMOGLOBIN A1C
Est. average glucose Bld gHb Est-mCnc: 108 mg/dL
Hgb A1c MFr Bld: 5.4 % (ref 4.8–5.6)

## 2021-04-19 LAB — HCV INTERPRETATION

## 2021-04-20 LAB — CULTURE, OB URINE

## 2021-04-20 LAB — URINE CULTURE, OB REFLEX: Organism ID, Bacteria: NO GROWTH

## 2021-04-21 ENCOUNTER — Encounter (HOSPITAL_COMMUNITY): Payer: Self-pay

## 2021-04-22 ENCOUNTER — Other Ambulatory Visit: Payer: Self-pay

## 2021-04-22 DIAGNOSIS — O99019 Anemia complicating pregnancy, unspecified trimester: Secondary | ICD-10-CM

## 2021-04-22 MED ORDER — FERROUS SULFATE 325 (65 FE) MG PO TABS: 325.0000 mg | ORAL_TABLET | ORAL | 1 refills | Status: DC

## 2021-05-01 ENCOUNTER — Other Ambulatory Visit: Payer: Self-pay

## 2021-05-01 ENCOUNTER — Encounter: Payer: Self-pay | Admitting: Obstetrics and Gynecology

## 2021-05-01 ENCOUNTER — Inpatient Hospital Stay (HOSPITAL_COMMUNITY)
Admission: AD | Admit: 2021-05-01 | Discharge: 2021-05-01 | Disposition: A | Discharge status: Home / Self Care | Payer: Medicaid Other | Attending: Obstetrics and Gynecology | Admitting: Obstetrics and Gynecology

## 2021-05-01 ENCOUNTER — Encounter (HOSPITAL_COMMUNITY): Payer: Self-pay | Admitting: Obstetrics and Gynecology

## 2021-05-01 ENCOUNTER — Ambulatory Visit (INDEPENDENT_AMBULATORY_CARE_PROVIDER_SITE_OTHER): Payer: Medicaid Other | Admitting: Obstetrics and Gynecology

## 2021-05-01 VITALS — BP 117/72 | HR 102 | Wt 136.9 lb

## 2021-05-01 DIAGNOSIS — M549 Dorsalgia, unspecified: Secondary | ICD-10-CM | POA: Insufficient documentation

## 2021-05-01 DIAGNOSIS — O479 False labor, unspecified: Secondary | ICD-10-CM | POA: Diagnosis not present

## 2021-05-01 DIAGNOSIS — Z3A36 36 weeks gestation of pregnancy: Secondary | ICD-10-CM

## 2021-05-01 DIAGNOSIS — O4703 False labor before 37 completed weeks of gestation, third trimester: Secondary | ICD-10-CM | POA: Insufficient documentation

## 2021-05-01 DIAGNOSIS — R112 Nausea with vomiting, unspecified: Secondary | ICD-10-CM | POA: Insufficient documentation

## 2021-05-01 DIAGNOSIS — R102 Pelvic and perineal pain: Secondary | ICD-10-CM | POA: Insufficient documentation

## 2021-05-01 DIAGNOSIS — O0930 Supervision of pregnancy with insufficient antenatal care, unspecified trimester: Secondary | ICD-10-CM

## 2021-05-01 DIAGNOSIS — O26893 Other specified pregnancy related conditions, third trimester: Secondary | ICD-10-CM | POA: Insufficient documentation

## 2021-05-01 DIAGNOSIS — R109 Unspecified abdominal pain: Secondary | ICD-10-CM | POA: Diagnosis not present

## 2021-05-01 LAB — COMPREHENSIVE METABOLIC PANEL
ALT: 15 U/L (ref 0–44)
AST: 18 U/L (ref 15–41)
Albumin: 2.5 g/dL — ABNORMAL LOW (ref 3.5–5.0)
Alkaline Phosphatase: 158 U/L — ABNORMAL HIGH (ref 38–126)
Anion gap: 7 (ref 5–15)
BUN: 7 mg/dL (ref 6–20)
CO2: 23 mmol/L (ref 22–32)
Calcium: 8.6 mg/dL — ABNORMAL LOW (ref 8.9–10.3)
Chloride: 105 mmol/L (ref 98–111)
Creatinine, Ser: 0.73 mg/dL (ref 0.44–1.00)
GFR, Estimated: >60 mL/min (ref >60)
Glucose, Bld: 86 mg/dL (ref 70–99)
Potassium: 3.8 mmol/L (ref 3.5–5.1)
Sodium: 135 mmol/L (ref 135–145)
Total Bilirubin: 0.6 mg/dL (ref 0.3–1.2)
Total Protein: 6.5 g/dL (ref 6.5–8.1)

## 2021-05-01 LAB — CBC
HCT: 28.3 % — ABNORMAL LOW (ref 36.0–46.0)
Hemoglobin: 9.2 g/dL — ABNORMAL LOW (ref 12.0–15.0)
MCH: 29.9 pg (ref 26.0–34.0)
MCHC: 32.5 g/dL (ref 30.0–36.0)
MCV: 91.9 fL (ref 80.0–100.0)
Platelets: 260 10*3/uL (ref 150–400)
RBC: 3.08 MIL/uL — ABNORMAL LOW (ref 3.87–5.11)
RDW: 13.2 % (ref 11.5–15.5)
WBC: 11.8 10*3/uL — ABNORMAL HIGH (ref 4.0–10.5)
nRBC: 0 % (ref 0.0–0.2)

## 2021-05-01 LAB — AMNISURE RUPTURE OF MEMBRANE (ROM) NOT AT ARMC: Amnisure ROM: NEGATIVE

## 2021-05-01 LAB — POCT FERN TEST: POCT Fern Test: NEGATIVE

## 2021-05-01 MED ORDER — ONDANSETRON 4 MG PO TBDP: 4.0000 mg | ORAL_TABLET | Freq: Four times a day (QID) | ORAL | 0 refills | Status: DC | PRN

## 2021-05-01 MED ORDER — ONDANSETRON HCL 4 MG/2ML IJ SOLN
4.0000 mg | Freq: Once | INTRAMUSCULAR | Status: AC
  Administered 2021-05-01: 02:00:00 4 mg via INTRAVENOUS
  Filled 2021-05-01: qty 2

## 2021-05-01 MED ORDER — LACTATED RINGERS IV SOLN: Freq: Once | INTRAVENOUS | Status: AC

## 2021-05-01 NOTE — MAU Provider Note (Addendum)
Chief Complaint:  Contractions and Abdominal Pain   Event Date/Time   First Provider Initiated Contact with Patient 05/01/21 0122     HPI: Sonya Stevens is a 20 y.o. G1P0 at 31w6dwho presents to maternity admissions reporting painful contractions, back pain, leaking of fluid and vomiting.  States vomited a few days ago and twice today   No known sick contacts.. She reports good fetal movement, denies LOF, vaginal bleeding, vaginal itching/burning, urinary symptoms, h/a, dizziness, diarrhea, constipation or fever/chills.    Abdominal Pain This is a new problem. The current episode started today. The onset quality is gradual. The problem occurs intermittently. The quality of the pain is described as cramping. The pain radiates to the back. Associated symptoms include vomiting. Pertinent negatives include no diarrhea, fever or frequency. Nothing relieves the symptoms. Past treatments include nothing.  Emesis  This is a new problem. The current episode started in the past 7 days. The problem occurs less than 2 times per day. The problem has been unchanged. There has been no fever. Associated symptoms include abdominal pain. Pertinent negatives include no diarrhea or fever. She has tried nothing for the symptoms.   RN Note: Sonya Stevens is a 20 y.o. at [redacted]w[redacted]d here in MAU reporting: ctx that are 7/10 and pelvic and back pain. Pt states that ctx have been on-going for the past week but have gotten worse today. Denies VB. Pt reports LOF when voiding, stating "when I go to pee I have to push it out and then I feel leaking afterwards". Fluid looks like "green snot" and is in her underwear. Endorses +FM. Onset of complaint: on-going Pain score: 7/10  Past Medical History: Past Medical History:  Diagnosis Date   Anemia     Past obstetric history: OB History  Gravida Para Term Preterm AB Living  1            SAB IAB Ectopic Multiple Live Births               # Outcome Date GA Lbr Len/2nd  Weight Sex Delivery Anes PTL Lv  1 Current             Past Surgical History: Past Surgical History:  Procedure Laterality Date   ABDOMINAL SURGERY      Family History: Family History  Problem Relation Age of Onset   Healthy Mother    Healthy Father     Social History: Social History   Tobacco Use   Smoking status: Never   Smokeless tobacco: Never  Vaping Use   Vaping Use: Never used  Substance Use Topics   Alcohol use: Not Currently   Drug use: Not Currently    Types: Marijuana    Comment: Last smoked March 2022    Allergies: No Known Allergies  Meds:  Medications Prior to Admission  Medication Sig Dispense Refill Last Dose   Blood Pressure Monitoring (BLOOD PRESSURE KIT) DEVI 1 Device by Does not apply route once a week. 1 each 0    docusate sodium (COLACE) 50 MG capsule Take 1 capsule (50 mg total) by mouth 2 (two) times daily. 60 capsule 5 More than a month   famotidine (PEPCID) 20 MG tablet Take 1 tablet (20 mg total) by mouth 2 (two) times daily. (Patient not taking: Reported on 04/18/2021) 30 tablet 0 More than a month   ferrous sulfate (FERROUSUL) 325 (65 FE) MG tablet Take 1 tablet (325 mg total) by mouth every other day. 60 tablet 1 More than  a month   ferrous sulfate 325 (65 FE) MG tablet Take 1 tablet (325 mg total) by mouth daily. 30 tablet 0 More than a month   NIFEdipine (PROCARDIA-XL/NIFEDICAL-XL) 30 MG 24 hr tablet Take 1 tablet (30 mg total) by mouth daily as needed (contractions). (Patient not taking: Reported on 04/18/2021) 30 tablet 0 More than a month   Prenatal Vit-Fe Fumarate-FA (PRENATAL VITAMINS) 28-0.8 MG TABS Take 1 tablet by mouth daily. 30 tablet 11 More than a month    I have reviewed patient's Past Medical Hx, Surgical Hx, Family Hx, Social Hx, medications and allergies.   ROS:  Review of Systems  Constitutional:  Negative for fever.  Gastrointestinal:  Positive for abdominal pain and vomiting. Negative for diarrhea.  Genitourinary:   Negative for frequency.  Other systems negative  Physical Exam  Patient Vitals for the past 24 hrs:  BP Temp Temp src Pulse Resp  05/01/21 0036 110/75 -- -- (!) 101 --  05/01/21 0027 110/74 -- -- 98 --  05/01/21 0026 -- 97.9 F (36.6 C) Oral 97 18   Constitutional: Well-developed, well-nourished female in no acute distress.  Cardiovascular: normal rate and rhythm Respiratory: normal effort, clear to auscultation bilaterally GI: Abd soft, non-tender, gravid appropriate for gestational age.   No rebound or guarding. MS: Extremities nontender, no edema, normal ROM Neurologic: Alert and oriented x 4.  GU: Neg CVAT.  PELVIC EXAM:  Dilation: Fingertip Effacement (%): 50 Exam by:: Izora Gala, RN  No change after one hour  FHT:  Baseline 130 , moderate variability, accelerations present, no decelerations Contractions: q 10-15 mins Irregular     Labs: Results for orders placed or performed during the hospital encounter of 05/01/21 (from the past 24 hour(s))  Fern Test     Status: Normal   Collection Time: 05/01/21 12:59 AM  Result Value Ref Range   POCT Fern Test Negative = intact amniotic membranes   Amnisure rupture of membrane (rom)not at [facility]     Status: None   Collection Time: 05/01/21  1:07 AM  Result Value Ref Range   Amnisure ROM NEGATIVE   CBC     Status: Abnormal   Collection Time: 05/01/21  1:42 AM  Result Value Ref Range   WBC 11.8 (H) 4.0 - 10.5 K/uL   RBC 3.08 (L) 3.87 - 5.11 MIL/uL   Hemoglobin 9.2 (L) 12.0 - 15.0 g/dL   HCT 28.3 (L) 36.0 - 46.0 %   MCV 91.9 80.0 - 100.0 fL   MCH 29.9 26.0 - 34.0 pg   MCHC 32.5 30.0 - 36.0 g/dL   RDW 13.2 11.5 - 15.5 %   Platelets 260 150 - 400 K/uL   nRBC 0.0 0.0 - 0.2 %  Comprehensive metabolic panel     Status: Abnormal   Collection Time: 05/01/21  1:42 AM  Result Value Ref Range   Sodium 135 135 - 145 mmol/L   Potassium 3.8 3.5 - 5.1 mmol/L   Chloride 105 98 - 111 mmol/L   CO2 23 22 - 32 mmol/L   Glucose, Bld  86 70 - 99 mg/dL   BUN 7 6 - 20 mg/dL   Creatinine, Ser 0.73 0.44 - 1.00 mg/dL   Calcium 8.6 (L) 8.9 - 10.3 mg/dL   Total Protein 6.5 6.5 - 8.1 g/dL   Albumin 2.5 (L) 3.5 - 5.0 g/dL   AST 18 15 - 41 U/L   ALT 15 0 - 44 U/L   Alkaline Phosphatase 158 (H)  38 - 126 U/L   Total Bilirubin 0.6 0.3 - 1.2 mg/dL   GFR, Estimated >60 >60 mL/min   Anion gap 7 5 - 15    B/Positive/-- (12/09 1147)  Imaging:  No results found.  MAU Course/MDM: I have ordered labs and reviewed results. Negative Amnisure.  No hypokalemia or leukocytosis NST reviewed, reactive with occasional contractions, not in labor  Treatments in MAU included IV hydration, zofran.  Felt much better after this Cervix rechecked with no change.    Assessment: [place] IUP at [redacted]w[redacted]d Irregular contractions Nausea and vomiting  Plan: Discharge home Rx zofran for prn use at home Labor precautions and fetal kick counts Follow up in Office for prenatal visits and recheck Encouraged to return if she develops worsening of symptoms, increase in pain, fever, or other concerning symptoms.  Pt stable at time of discharge.  Hansel Feinstein CNM, MSN Certified Nurse-Midwife 05/01/2021 1:22 AM

## 2021-05-01 NOTE — Progress Notes (Signed)
° ° °  PRENATAL VISIT NOTE  Subjective:  Sonya Stevens is a 20 y.o. G1P0 at [redacted]w[redacted]d being seen today for ongoing prenatal care.  She is currently monitored for the following issues for this low-risk pregnancy and has Encounter for supervision of normal first pregnancy in third trimester and Late prenatal care on their problem list.  Patient reports no complaints.  Contractions: Irritability. Vag. Bleeding: Small.  Movement: Present. Denies leaking of fluid.   The following portions of the patient's history were reviewed and updated as appropriate: allergies, current medications, past family history, past medical history, past social history, past surgical history and problem list.   Objective:   Vitals:   05/01/21 1413  BP: 117/72  Pulse: (!) 102  Weight: 136 lb 14.4 oz (62.1 kg)    Fetal Status: Fetal Heart Rate (bpm): 140 Fundal Height: 35 cm Movement: Present  Presentation: Vertex  General:  Alert, oriented and cooperative. Patient is in no acute distress.  Skin: Skin is warm and dry. No rash noted.   Cardiovascular: Normal heart rate noted  Respiratory: Normal respiratory effort, no problems with respiration noted  Abdomen: Soft, gravid, appropriate for gestational age.  Pain/Pressure: Present     Pelvic: Cervical exam deferred        Extremities: Normal range of motion.  Edema: None  Mental Status: Normal mood and affect. Normal behavior. Normal judgment and thought content.   Assessment and Plan:  Pregnancy: G1P0 at [redacted]w[redacted]d 1. [redacted] weeks gestation of pregnancy GBS today. Pt states she had an orange drink at 28wks at her ob in [place]. Pt called while in the room and they are closed until next week. Pt to try and get records as well as u/s; nothing on care everywhere. Pt has 12/29 mfm anatomy u/s  Preterm labor symptoms and general obstetric precautions including but not limited to vaginal bleeding, contractions, leaking of fluid and fetal movement were reviewed in detail with  the patient. Please refer to After Visit Summary for other counseling recommendations.   Return in about 1 week (around 05/08/2021) for md or app, low risk ob, in person.  Future Appointments  Date Time Provider Department Center  05/08/2021 10:30 AM [facility] NURSE [facility]  05/08/2021 10:45 AM [facility]  05/13/2021 11:15 AM Crisoforo Oxford, Charlesetta Garibaldi, CNM [facility]    [place] Bing, MD

## 2021-05-01 NOTE — MAU Note (Signed)
Pt signed physical AVS d/t E-signature malfunction in room.

## 2021-05-01 NOTE — MAU Note (Signed)
Sonya Stevens is a 20 y.o. at [redacted]w[redacted]d here in MAU reporting: ctx that are 7/10 and pelvic and back pain. Pt states that ctx have been on-going for the past week but have gotten worse today. Denies VB. Pt reports LOF when voiding, stating "when I go to pee I have to push it out and then I feel leaking afterwards". Fluid looks like "green snot" and is in her underwear. Endorses +FM. Onset of complaint: on-going Pain score: 7/10 Vitals:   05/01/21 0027 05/01/21 0036  BP: 110/74 110/75  Pulse: 98 (!) 101  Resp:    Temp:       FHT: 130

## 2021-05-03 LAB — STREP GP B NAA: Strep Gp B NAA: NEGATIVE

## 2021-05-06 ENCOUNTER — Encounter: Payer: Self-pay | Admitting: Obstetrics and Gynecology

## 2021-05-06 DIAGNOSIS — O99019 Anemia complicating pregnancy, unspecified trimester: Secondary | ICD-10-CM | POA: Insufficient documentation

## 2021-05-08 ENCOUNTER — Ambulatory Visit: Payer: Medicaid Other

## 2021-05-08 ENCOUNTER — Telehealth: Payer: Self-pay

## 2021-05-08 NOTE — Telephone Encounter (Signed)
Called patient to rescheduled missed Anatomy ultrasound 05/08/21 - patient currently at [facility] to be checked - is having contractions.   Patient to call [facility] back to reschedule Anatomy if not having contractions.

## 2021-05-13 ENCOUNTER — Telehealth: Payer: Self-pay | Admitting: Lactation Services

## 2021-05-13 ENCOUNTER — Encounter: Payer: Self-pay | Admitting: Family Medicine

## 2021-05-13 ENCOUNTER — Encounter: Payer: Medicaid Other | Admitting: Student

## 2021-05-13 ENCOUNTER — Encounter: Payer: Medicaid Other | Admitting: Medical

## 2021-05-13 DIAGNOSIS — Z148 Genetic carrier of other disease: Secondary | ICD-10-CM | POA: Insufficient documentation

## 2021-05-13 DIAGNOSIS — D563 Thalassemia minor: Secondary | ICD-10-CM | POA: Insufficient documentation

## 2021-05-13 NOTE — Telephone Encounter (Signed)
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   Recommended calling [facility] at [phone] to schedule a Telephone Genetic Counseling Session to discuss the results further.   Reviewed recommendation that FOB be tested also. Reviewed we have the saliva kits in the office that she can pick up at her next visit to take home to complete and mail back to be completed.   Patient voiced understanding with no other questions or concerns as needed.

## 2022-06-17 ENCOUNTER — Emergency Department (HOSPITAL_COMMUNITY)
Admission: EM | Admit: 2022-06-17 | Discharge: 2022-06-17 | Discharge status: Against Medical Advice | Payer: Medicaid Other | Attending: Emergency Medicine | Admitting: Emergency Medicine

## 2022-06-17 ENCOUNTER — Emergency Department (HOSPITAL_COMMUNITY): Payer: Medicaid Other

## 2022-06-17 ENCOUNTER — Encounter (HOSPITAL_COMMUNITY): Payer: Self-pay

## 2022-06-17 ENCOUNTER — Other Ambulatory Visit: Payer: Self-pay

## 2022-06-17 DIAGNOSIS — Z5321 Procedure and treatment not carried out due to patient leaving prior to being seen by health care provider: Secondary | ICD-10-CM | POA: Diagnosis not present

## 2022-06-17 DIAGNOSIS — Z1152 Encounter for screening for COVID-19: Secondary | ICD-10-CM | POA: Insufficient documentation

## 2022-06-17 DIAGNOSIS — R059 Cough, unspecified: Secondary | ICD-10-CM | POA: Insufficient documentation

## 2022-06-17 DIAGNOSIS — K1379 Other lesions of oral mucosa: Secondary | ICD-10-CM | POA: Insufficient documentation

## 2022-06-17 LAB — RESP PANEL BY RT-PCR (RSV, FLU A&B, COVID)  RVPGX2
Influenza A by PCR: NEGATIVE
Influenza B by PCR: POSITIVE — AB
Resp Syncytial Virus by PCR: NEGATIVE
SARS Coronavirus 2 by RT PCR: NEGATIVE

## 2022-06-17 NOTE — ED Provider Triage Note (Signed)
Emergency Medicine Provider Triage Evaluation Note  Sonya Stevens , a 22 y.o. female  was evaluated in triage.  Pt complains of cough and sores on her mouth.  Her infant also has a cough.  She also recently had diarrhea about 2 weeks ago which has resolved..  Review of Systems  Positive: Cough and mouth sores Negative: Fever  Physical Exam  BP 109/65 (BP Location: Right Arm)   Pulse 85   Temp 98 F (36.7 C) (Oral)   Resp 17   SpO2 97%  Gen:   Awake, no distress   Resp:  Normal effort  MSK:   Moves extremities without difficulty  Other:  Sores consistent with HSV 1 outbreak  Medical Decision Making  Medically screening exam initiated at 3:21 PM.  Appropriate orders placed.  Uriah Saif was informed that the remainder of the evaluation will be completed by another provider, this initial triage assessment does not replace that evaluation, and the importance of remaining in the ED until their evaluation is complete.     Margarita Mail, PA-C 06/17/22 1525

## 2022-06-17 NOTE — ED Notes (Signed)
No answer for vitals x3; not visualized in lobby or outside

## 2022-06-17 NOTE — ED Triage Notes (Signed)
Pt arrives with c/o cough that has been going on for about 1-2 weeks. Per pt, she did have n/v/d before the cough started. Pt also c/o cold sore on her lips. Pts child has also been sick.

## 2022-06-17 NOTE — ED Notes (Signed)
Patient is in peds with their child.

## 2022-10-06 IMAGING — US US MFM OB LIMITED
1 series · 15 of 28 positions shown · non-contrast
Comparison: none

[Series 1: us mfm ob limited · 46 acquisitions, 15 frames shown]
[im 1/46]
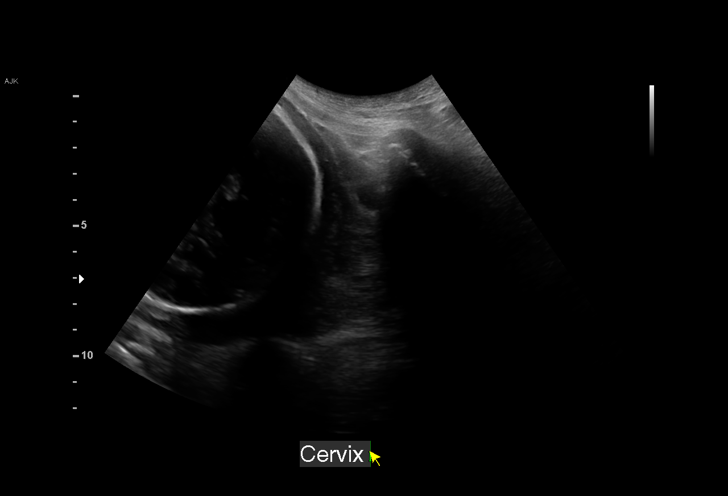
[im 4/46]
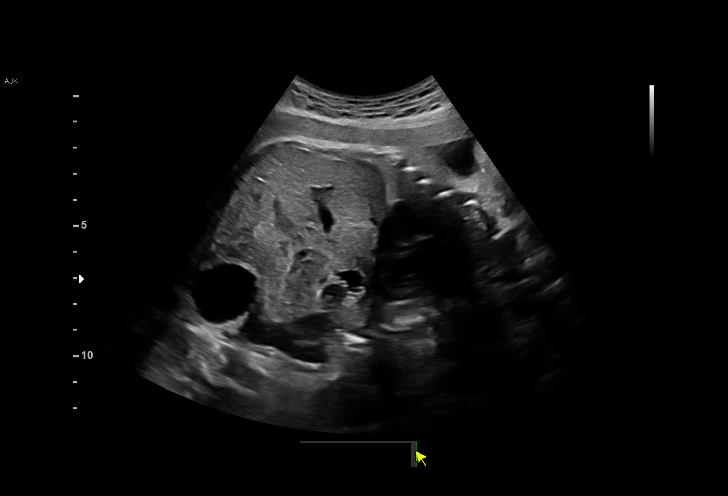
[im 7/46]
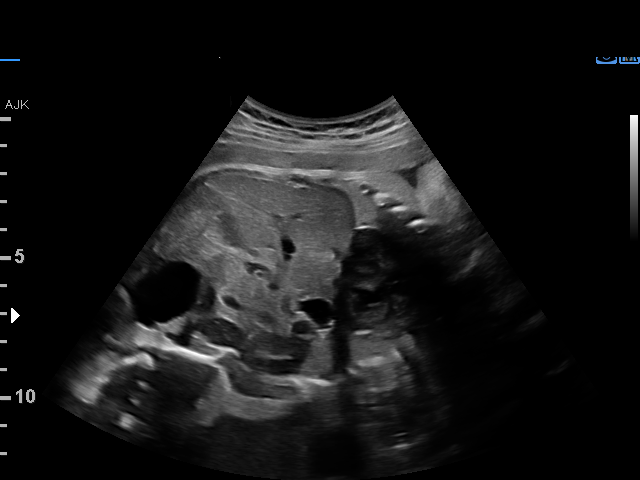
[im 11/46]
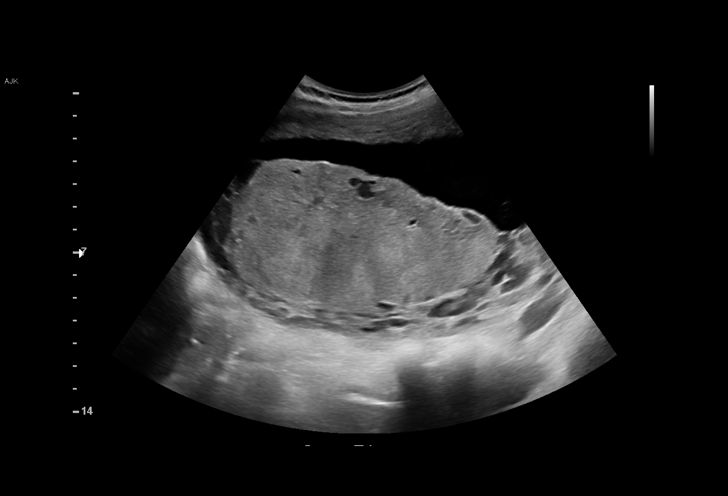
[im 14/46]
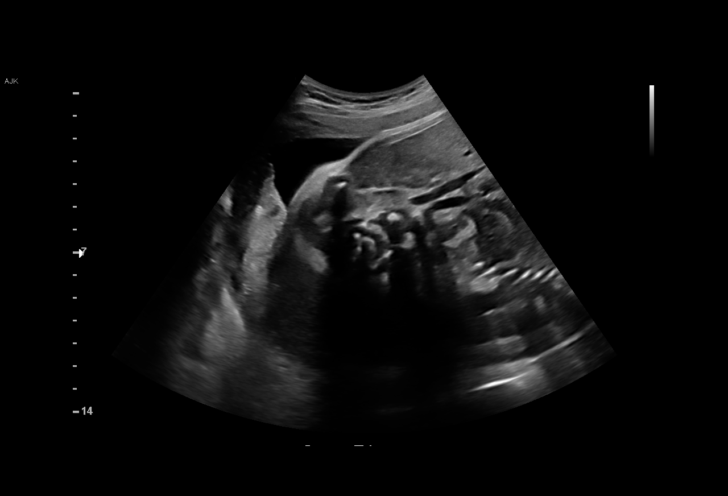
[im 17/46]
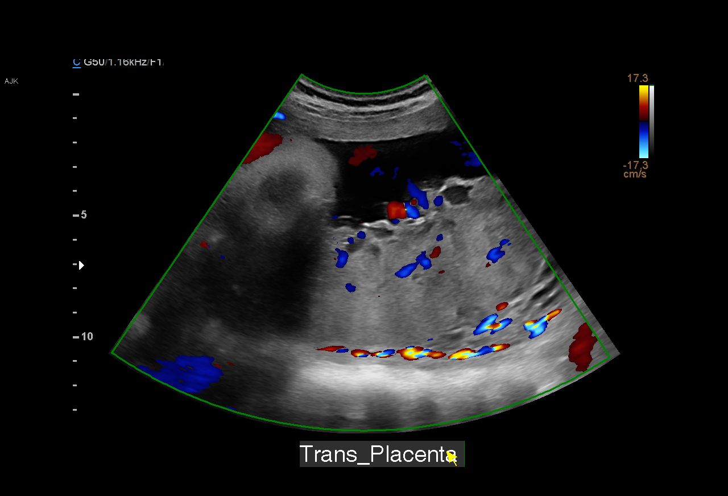
[im 21/46]
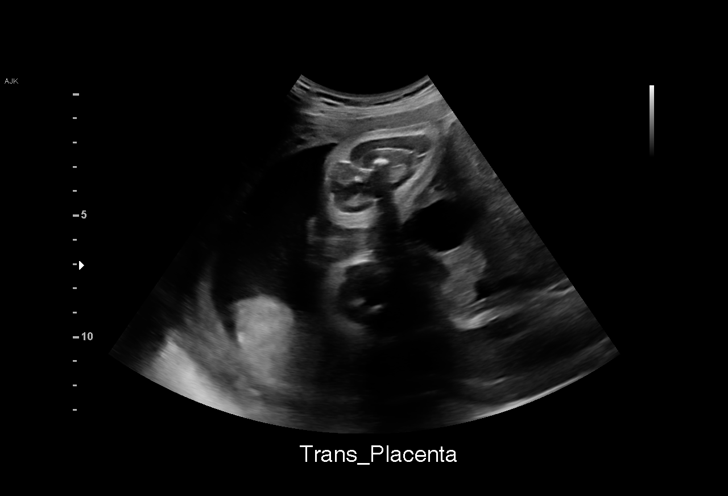
[im 24/46]
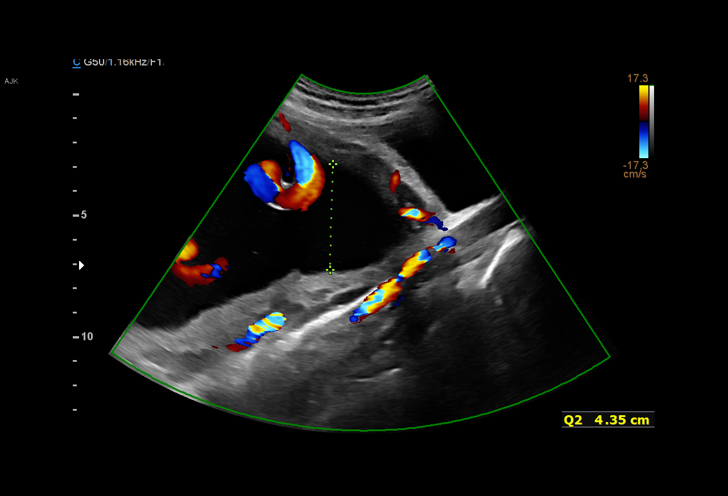
[im 26/46]
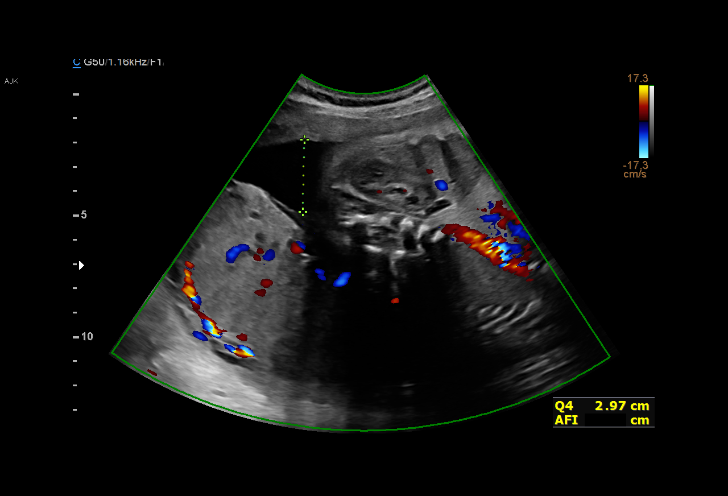
[im 29/46]
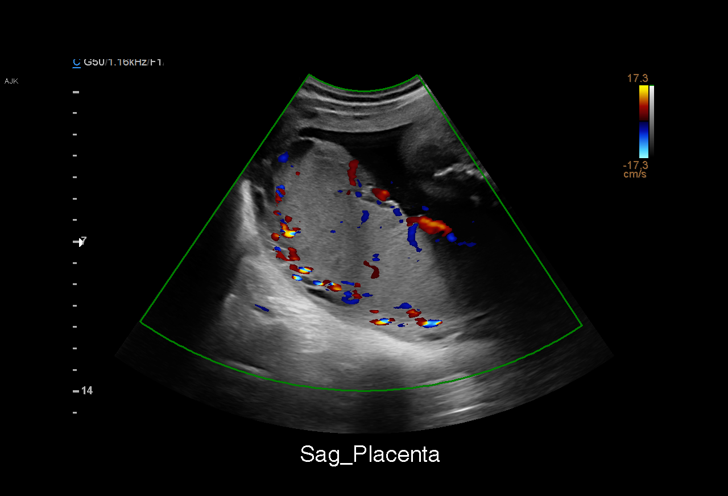
[im 32/46]
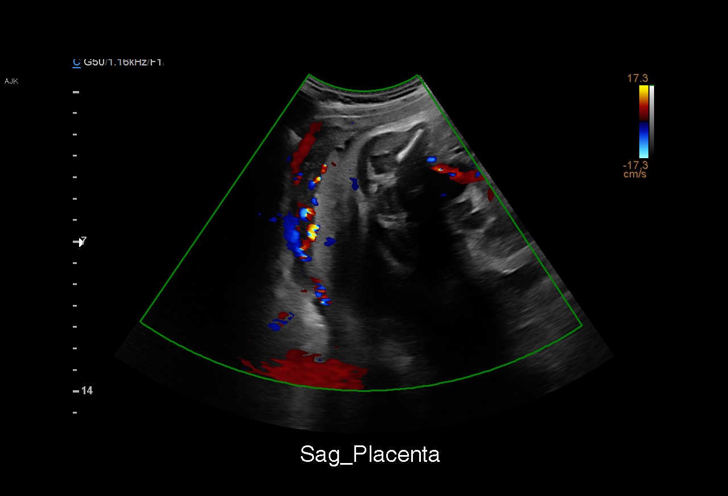
[im 36/46]
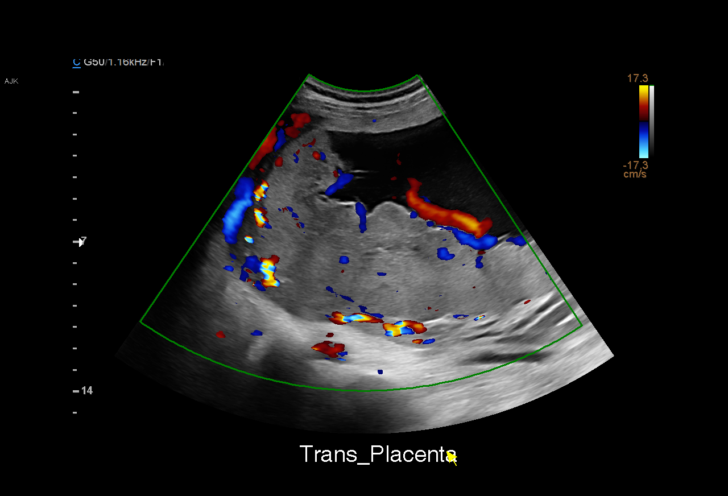
[im 39/46]
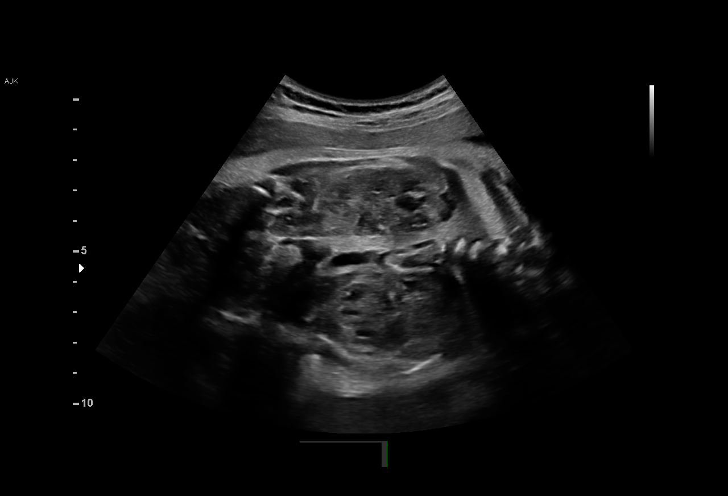
[im 42/46]
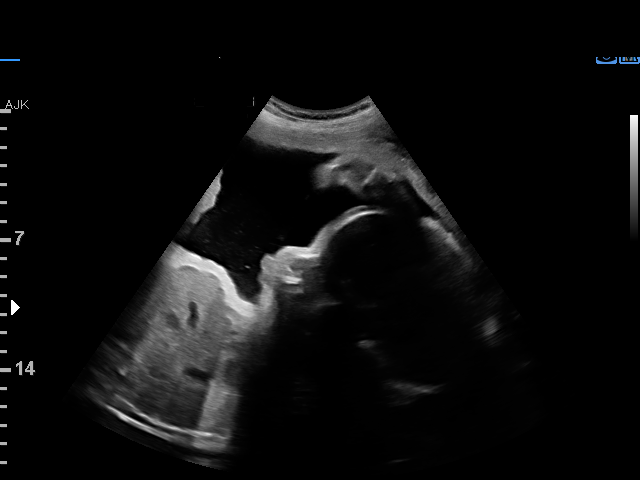
[im 46/46]
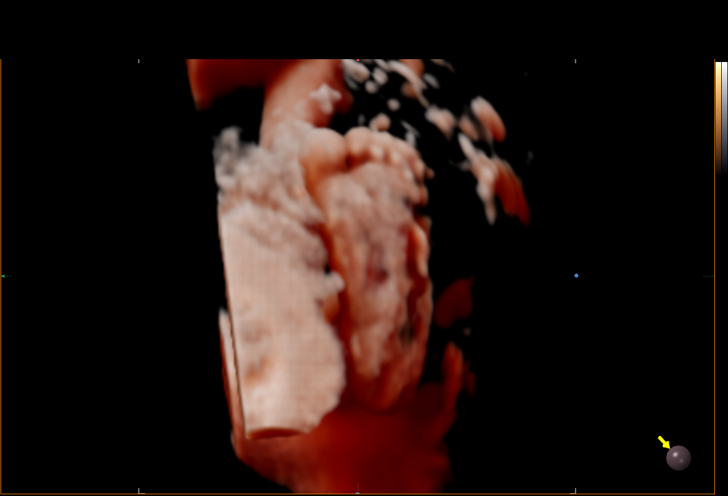

[15 of 28 positions shown; findings below may reference images not displayed]

1  US MFM OB LIMITED                     76815.01    AIRINGAS BULGER

Indications

 Pelvic pain affecting pregnancy in third
 trimester
 32 weeks gestation of pregnancy
Fetal Evaluation

 Num Of Fetuses:         1
 Fetal Heart Rate(bpm):  148
 Cardiac Activity:       Observed
 Presentation:           Cephalic
 Placenta:               Posterior
 P. Cord Insertion:      Visualized

 Amniotic Fluid
 AFI FV:      Within normal limits

 AFI Sum(cm)     %Tile       Largest Pocket(cm)
 20.1            76

 RUQ(cm)       RLQ(cm)       LUQ(cm)        LLQ(cm)
 7.5           3

 Comment:    No placental abruption or previa identified. Appropriate
             movement, tone, and breathing motions incidentally noted.
OB History

 Gravidity:    1
Gestational Age

 Clinical EDD:  32w 3d                                        EDD:   05/23/21
 Best:          32w 3d     Det. By:  Clinical EDD             EDD:   05/23/21
Cervix Uterus Adnexa
 Cervix
 Length:           3.28  cm.
 Normal appearance by transabdominal scan.

 Uterus
 No abnormality visualized.

 Right Ovary
 Not visualized.

 Left Ovary
 Not visualized.
Comments

 This patient presented to the TRIPLE due to vaginal bleeding.

 A limited ultrasound performed today shows that the fetus is
 in the vertex presentation.

 There was normal amniotic fluid noted.

 A normal-appearing posterior placenta is noted.

## 2022-11-20 ENCOUNTER — Encounter: Payer: 59 | Admitting: Family

## 2022-11-20 NOTE — Progress Notes (Signed)
Erroneous encounter-disregard

## 2023-01-08 ENCOUNTER — Encounter (HOSPITAL_COMMUNITY): Payer: Self-pay

## 2023-01-08 ENCOUNTER — Ambulatory Visit (HOSPITAL_COMMUNITY)
Admission: EM | Admit: 2023-01-08 | Discharge: 2023-01-08 | Disposition: A | Discharge status: Home / Self Care | Payer: 59 | Attending: Emergency Medicine | Admitting: Emergency Medicine

## 2023-01-08 DIAGNOSIS — K0889 Other specified disorders of teeth and supporting structures: Secondary | ICD-10-CM | POA: Diagnosis not present

## 2023-01-08 MED ORDER — IBUPROFEN 800 MG PO TABS: 800.0000 mg | ORAL_TABLET | Freq: Three times a day (TID) | ORAL | 0 refills | Status: DC

## 2023-01-08 MED ORDER — AMOXICILLIN-POT CLAVULANATE 875-125 MG PO TABS: 1.0000 | ORAL_TABLET | Freq: Two times a day (BID) | ORAL | 0 refills | Status: DC

## 2023-01-08 NOTE — Discharge Instructions (Addendum)
Take all antibiotics as prescribed and ibuprofen every 8 hours for pain and swelling, take these with food. Avoid hot or cold food or chewing on the affected side.  It is important that you follow-up with a dentist because if this problem is not addressed then it will keep reoccurring.  Below are some dental resources.  Return to clinic for new or urgent symptoms.  [facility] [place] Address: [address] [place], [place], [place] [address] Phone: [phone]  [facility] [phone] extension [phone] [address]  Dr. Lawrence Marseilles [phone] [address]  [place] [phone] 2100 [facility] [place].  [facility] mission [phone] extension [phone], [place], [place], [address] First come first serve for the first 10 clients.  May do simple extractions only, no wisdom teeth or surgery.  You may try the second for Thursday of the month starting at 6:30 AM.  [facility] You may call the school to see if they are still helping to provide dental care for emergent cases.

## 2023-01-08 NOTE — ED Provider Notes (Signed)
[facility]    CSN: [id number] Arrival date & time: 01/08/23  8295      History   Chief Complaint No chief complaint on file.   HPI Dorthella Archacki is a 22 y.o. female.   Patient presents to clinic for left lower dental pain that has been ongoing for the past 3 days.  She has noticed some soft tissue swelling as well.  She has been taking Tylenol.    She does not currently have a dentist and has multiple open cavities.   Able to eat and drink. No fevers. No drainage.     The history is provided by the patient and medical records.    Past Medical History:  Diagnosis Date   Anemia     Patient Active Problem List   Diagnosis Date Noted   Alpha thalassemia silent carrier 05/13/2021   Increase risk of being a carrier of SMA 05/13/2021   Anemia in pregnancy 05/06/2021   Encounter for supervision of normal first pregnancy in third trimester 04/18/2021   Late prenatal care 04/18/2021    Past Surgical History:  Procedure Laterality Date   ILEOSTOMY      OB History     Gravida  1   Para      Term      Preterm      AB      Living         SAB      IAB      Ectopic      Multiple      Live Births               Home Medications    Prior to Admission medications   Medication Sig Start Date End Date Taking? Authorizing Provider  amoxicillin-clavulanate (AUGMENTIN) 875-125 MG tablet Take 1 tablet by mouth every 12 (twelve) hours. 01/08/23  Yes Rinaldo Ratel, [place], FNP  ibuprofen (ADVIL) 800 MG tablet Take 1 tablet (800 mg total) by mouth 3 (three) times daily. 01/08/23  Yes Rinaldo Ratel, [place], FNP  Blood Pressure Monitoring (BLOOD PRESSURE KIT) DEVI 1 Device by Does not apply route once a week. Patient not taking: Reported on 05/01/2021 04/18/21   Brand Males, CNM  docusate sodium (COLACE) 50 MG capsule Take 1 capsule (50 mg total) by mouth 2 (two) times daily. 01/04/19   Wallis Bamberg, PA-C  ferrous sulfate (FERROUSUL) 325 (65 FE)  MG tablet Take 1 tablet (325 mg total) by mouth every other day. Patient not taking: Reported on 05/01/2021 04/22/21   Brand Males, CNM  ferrous sulfate 325 (65 FE) MG tablet Take 1 tablet (325 mg total) by mouth daily. 12/07/18   Muthersbaugh, Dahlia Client, PA-C  NIFEdipine (PROCARDIA-XL/NIFEDICAL-XL) 30 MG 24 hr tablet Take 1 tablet (30 mg total) by mouth daily as needed (contractions). Patient not taking: Reported on 04/18/2021 03/31/21 03/31/22  Allayne Stack, DO  ondansetron (ZOFRAN-ODT) 4 MG disintegrating tablet Take 1 tablet (4 mg total) by mouth every 6 (six) hours as needed for nausea. Patient not taking: Reported on 05/01/2021 05/01/21   Aviva Signs, CNM    Family History Family History  Problem Relation Age of Onset   Healthy Mother    Healthy Father     Social History Social History   Tobacco Use   Smoking status: Never   Smokeless tobacco: Never  Vaping Use   Vaping status: Never Used  Substance Use Topics   Alcohol use: Not Currently  Drug use: Not Currently    Types: Marijuana    Comment: Last smoked March 2022     Allergies   Patient has no known allergies.   Review of Systems Review of Systems  Constitutional:  Negative for fever.  HENT:  Positive for dental problem.      Physical Exam Triage Vital Signs ED Triage Vitals [01/08/23 0845]  Encounter Vitals Group     BP 99/61     Systolic BP Percentile      Diastolic BP Percentile      Pulse Rate 77     Resp 14     Temp 98 F (36.7 C)     Temp Source Oral     SpO2 97 %     Weight      Height      Head Circumference      Peak Flow      Pain Score      Pain Loc      Pain Education      Exclude from Growth Chart    No data found.  Updated Vital Signs BP 99/61 (BP Location: Left Arm)   Pulse 77   Temp 98 F (36.7 C) (Oral)   Resp 14   LMP 01/08/2023   SpO2 97%   Visual Acuity Right Eye Distance:   Left Eye Distance:   Bilateral Distance:    Right Eye Near:   Left  Eye Near:    Bilateral Near:     Physical Exam Vitals and nursing note reviewed.  Constitutional:      Appearance: Normal appearance.  HENT:     Head: Normocephalic and atraumatic.     Right Ear: External ear normal.     Left Ear: External ear normal.     Nose: Nose normal.     Mouth/Throat:     Mouth: Mucous membranes are moist.     Dentition: Abnormal dentition. Dental tenderness and dental caries present.      Comments: Multiple dental caries with significant decay at tooth 19, gums are tender. No obvious abscess.  Eyes:     Conjunctiva/sclera: Conjunctivae normal.  Cardiovascular:     Rate and Rhythm: Normal rate.  Pulmonary:     Effort: Pulmonary effort is normal. No respiratory distress.  Musculoskeletal:        General: Normal range of motion.  Lymphadenopathy:     Cervical: No cervical adenopathy.  Skin:    General: Skin is warm and dry.  Neurological:     General: No focal deficit present.     Mental Status: She is alert and oriented to person, place, and time.  Psychiatric:        Mood and Affect: Mood normal.        Behavior: Behavior normal.      UC Treatments / Results  Labs (all labs ordered are listed, but only abnormal results are displayed) Labs Reviewed - No data to display  EKG   Radiology No results found.  Procedures Procedures (including critical care time)  Medications Ordered in UC Medications - No data to display  Initial Impression / Assessment and Plan / UC Course  I have reviewed the triage vital signs and the nursing notes.  Pertinent labs & imaging results that were available during my care of the patient were reviewed by me and considered in my medical decision making (see chart for details).  Vitals in triage reviewed, patient is hemodynamically stable.  Multiple dental caries and  significant decay of tooth 19, gumline is tender.  No obvious abscess.  Tolerating oral food and fluids.  Augmentin prescribed for possible dental  infection, ibuprofen for pain and inflammation.  Provided with dental resources.  Plan of care, follow-up care return precautions given, no questions at this time.      Final Clinical Impressions(s) / UC Diagnoses   Final diagnoses:  Pain, dental     Discharge Instructions      Take all antibiotics as prescribed and ibuprofen every 8 hours for pain and swelling, take these with food. Avoid hot or cold food or chewing on the affected side.  It is important that you follow-up with a dentist because if this problem is not addressed then it will keep reoccurring.  Below are some dental resources.  Return to clinic for new or urgent symptoms.  [facility] dental service [place] Address: [address] [place], [place], [place] [address] Phone: [phone]  [facility] [phone] extension [phone] [address]  Dr. Lawrence Marseilles [phone] [address].  [place] [phone] 2100 [facility] [place].  Rescue mission [phone] extension [phone], [place], [place], [address] First come first serve for the first 10 clients.  May do simple extractions only, no wisdom teeth or surgery.  You may try the second for Thursday of the month starting at 6:30 AM.  [facility] You may call the school to see if they are still helping to provide dental care for emergent cases.      ED Prescriptions     Medication Sig Dispense Auth. Provider   amoxicillin-clavulanate (AUGMENTIN) 875-125 MG tablet Take 1 tablet by mouth every 12 (twelve) hours. 14 tablet Rinaldo Ratel, [place], [place]   ibuprofen (ADVIL) 800 MG tablet Take 1 tablet (800 mg total) by mouth 3 (three) times daily. 21 tablet Sunaina Ferrando, [place], [place]      PDMP not reviewed this encounter.   Rinaldo Ratel [place], [place] 01/08/23 [phone]

## 2023-01-08 NOTE — ED Triage Notes (Signed)
 Here for dental pain and swelling x 3 days.

## 2023-02-23 DIAGNOSIS — A599 Trichomoniasis, unspecified: Secondary | ICD-10-CM | POA: Diagnosis not present

## 2023-02-23 DIAGNOSIS — Z1331 Encounter for screening for depression: Secondary | ICD-10-CM | POA: Diagnosis not present

## 2023-02-23 DIAGNOSIS — Z1321 Encounter for screening for nutritional disorder: Secondary | ICD-10-CM | POA: Diagnosis not present

## 2023-02-23 DIAGNOSIS — Z130 Encounter for screening for diseases of the blood and blood-forming organs and certain disorders involving the immune mechanism: Secondary | ICD-10-CM | POA: Diagnosis not present

## 2023-02-23 DIAGNOSIS — Z13228 Encounter for screening for other metabolic disorders: Secondary | ICD-10-CM | POA: Diagnosis not present

## 2023-02-23 DIAGNOSIS — Z0001 Encounter for general adult medical examination with abnormal findings: Secondary | ICD-10-CM | POA: Diagnosis not present

## 2023-02-23 DIAGNOSIS — L905 Scar conditions and fibrosis of skin: Secondary | ICD-10-CM | POA: Diagnosis not present

## 2023-02-23 DIAGNOSIS — Z113 Encounter for screening for infections with a predominantly sexual mode of transmission: Secondary | ICD-10-CM | POA: Diagnosis not present

## 2023-02-23 DIAGNOSIS — Z713 Dietary counseling and surveillance: Secondary | ICD-10-CM | POA: Diagnosis not present

## 2023-02-23 DIAGNOSIS — Z1329 Encounter for screening for other suspected endocrine disorder: Secondary | ICD-10-CM | POA: Diagnosis not present

## 2023-02-23 DIAGNOSIS — Z91410 Personal history of adult physical and sexual abuse: Secondary | ICD-10-CM | POA: Diagnosis not present

## 2023-02-23 DIAGNOSIS — Z7182 Exercise counseling: Secondary | ICD-10-CM | POA: Diagnosis not present

## 2023-03-25 DIAGNOSIS — F4310 Post-traumatic stress disorder, unspecified: Secondary | ICD-10-CM | POA: Diagnosis not present

## 2023-03-25 DIAGNOSIS — F411 Generalized anxiety disorder: Secondary | ICD-10-CM | POA: Diagnosis not present

## 2023-04-17 ENCOUNTER — Encounter (HOSPITAL_COMMUNITY): Payer: Self-pay | Admitting: Emergency Medicine

## 2023-04-17 ENCOUNTER — Ambulatory Visit (HOSPITAL_COMMUNITY)
Admission: EM | Admit: 2023-04-17 | Discharge: 2023-04-17 | Disposition: A | Discharge status: Home / Self Care | Payer: 59 | Attending: Emergency Medicine | Admitting: Emergency Medicine

## 2023-04-17 DIAGNOSIS — J069 Acute upper respiratory infection, unspecified: Secondary | ICD-10-CM | POA: Diagnosis not present

## 2023-04-17 MED ORDER — BENZONATATE 200 MG PO CAPS: 200.0000 mg | ORAL_CAPSULE | Freq: Three times a day (TID) | ORAL | 0 refills | Status: DC

## 2023-04-17 NOTE — Discharge Instructions (Addendum)
Use the [employer] as needed for cough.  For any congestion you can take 1200 mg of Mucinex daily.  You can also do warm saline gargles, drink tea with honey and sleep with a humidifier.  Your symptoms should improve over the next 5 to 7 days, if no improvement or any changes please return to clinic.

## 2023-04-17 NOTE — ED Provider Notes (Signed)
[facility]    CSN: [id number] Arrival date & time: 04/17/23  1237      History   Chief Complaint Chief Complaint  Patient presents with   Cough   Sore Throat    HPI Sonya Stevens is a 22 y.o. female.   Patient presents to clinic for complaints of a mild dry cough and a sore throat that has been present for the past 2 days.  She has been taking DayQuil.  Cough is nonproductive.  No wheezing or shortness of breath.  No ear pain.  No congestion.  Denies fevers, no hot or cold chills.  No abdominal pain, emesis or diarrhea.  Her child is sick as well and she brought him with her to clinic for evaluation.  The history is provided by the patient and medical records.  Cough Sore Throat    Past Medical History:  Diagnosis Date   Anemia     Patient Active Problem List   Diagnosis Date Noted   Alpha thalassemia silent carrier 05/13/2021   Increase risk of being a carrier of SMA 05/13/2021   Anemia in pregnancy 05/06/2021   Encounter for supervision of normal first pregnancy in third trimester 04/18/2021   Late prenatal care 04/18/2021    Past Surgical History:  Procedure Laterality Date   ILEOSTOMY      OB History     Gravida  1   Para      Term      Preterm      AB      Living         SAB      IAB      Ectopic      Multiple      Live Births               Home Medications    Prior to Admission medications   Medication Sig Start Date End Date Taking? Authorizing Provider  benzonatate (TESSALON) 200 MG capsule Take 1 capsule (200 mg total) by mouth every 8 (eight) hours. 04/17/23  Yes Rinaldo Ratel, [place], FNP  Blood Pressure Monitoring (BLOOD PRESSURE KIT) DEVI 1 Device by Does not apply route once a week. Patient not taking: Reported on 05/01/2021 04/18/21   Brand Males, CNM  docusate sodium (COLACE) 50 MG capsule Take 1 capsule (50 mg total) by mouth 2 (two) times daily. 01/04/19   Wallis Bamberg, PA-C  ferrous sulfate  (FERROUSUL) 325 (65 FE) MG tablet Take 1 tablet (325 mg total) by mouth every other day. Patient not taking: Reported on 05/01/2021 04/22/21   Brand Males, CNM  ferrous sulfate 325 (65 FE) MG tablet Take 1 tablet (325 mg total) by mouth daily. 12/07/18   Muthersbaugh, Dahlia Client, PA-C  ibuprofen (ADVIL) 800 MG tablet Take 1 tablet (800 mg total) by mouth 3 (three) times daily. 01/08/23   Wana Mount, [place], FNP  NIFEdipine (PROCARDIA-XL/NIFEDICAL-XL) 30 MG 24 hr tablet Take 1 tablet (30 mg total) by mouth daily as needed (contractions). Patient not taking: Reported on 04/18/2021 03/31/21 03/31/22  Allayne Stack, DO  ondansetron (ZOFRAN-ODT) 4 MG disintegrating tablet Take 1 tablet (4 mg total) by mouth every 6 (six) hours as needed for nausea. Patient not taking: Reported on 05/01/2021 05/01/21   Aviva Signs, CNM    Family History Family History  Problem Relation Age of Onset   Healthy Mother    Healthy Father     Social History Social History  Tobacco Use   Smoking status: Never   Smokeless tobacco: Never  Vaping Use   Vaping status: Never Used  Substance Use Topics   Alcohol use: Not Currently   Drug use: Not Currently    Types: Marijuana    Comment: Last smoked March 2022     Allergies   Patient has no known allergies.   Review of Systems Review of Systems  Per HPI   Physical Exam Triage Vital Signs ED Triage Vitals [04/17/23 1321]  Encounter Vitals Group     BP      Systolic BP Percentile      Diastolic BP Percentile      Pulse      Resp      Temp      Temp src      SpO2      Weight      Height      Head Circumference      Peak Flow      Pain Score 6     Pain Loc      Pain Education      Exclude from Growth Chart    No data found.  Updated Vital Signs BP 100/65 (BP Location: Right Arm)   Pulse 83   Temp 98.1 F (36.7 C) (Oral)   Resp 16   LMP 04/02/2023   SpO2 99%   Visual Acuity Right Eye Distance:   Left Eye Distance:    Bilateral Distance:    Right Eye Near:   Left Eye Near:    Bilateral Near:     Physical Exam Vitals and nursing note reviewed.  Constitutional:      Appearance: She is well-developed.  HENT:     Head: Normocephalic and atraumatic.     Nose: No congestion or rhinorrhea.     Mouth/Throat:     Mouth: Mucous membranes are moist.     Pharynx: Uvula midline. Posterior oropharyngeal erythema present.     Tonsils: No tonsillar exudate or tonsillar abscesses. 1+ on the right. 1+ on the left.  Eyes:     Conjunctiva/sclera: Conjunctivae normal.  Cardiovascular:     Rate and Rhythm: Normal rate and regular rhythm.     Heart sounds: Normal heart sounds. No murmur heard. Pulmonary:     Effort: Pulmonary effort is normal. No respiratory distress.     Breath sounds: Normal breath sounds.  Musculoskeletal:     Cervical back: Normal range of motion.  Skin:    General: Skin is warm and dry.  Neurological:     General: No focal deficit present.     Mental Status: She is alert and oriented to person, place, and time.  Psychiatric:        Mood and Affect: Mood normal.        Behavior: Behavior normal.      UC Treatments / Results  Labs (all labs ordered are listed, but only abnormal results are displayed) Labs Reviewed - No data to display  EKG   Radiology No results found.  Procedures Procedures (including critical care time)  Medications Ordered in UC Medications - No data to display  Initial Impression / Assessment and Plan / UC Course  I have reviewed the triage vital signs and the nursing notes.  Pertinent labs & imaging results that were available during my care of the patient were reviewed by me and considered in my medical decision making (see chart for details).  Vitals and triage reviewed, patient is  hemodynamically stable.  Lungs are vesicular, heart with regular rate and rhythm.  Posterior pharynx with erythema.  Tonsils without exudate, uvula midline.  Low  concern for PTA.  Discussed symptoms are most likely viral, symptomatic management reviewed.  Plan of care, follow-up care return precautions given, no questions at this time.     Final Clinical Impressions(s) / UC Diagnoses   Final diagnoses:  Viral URI with cough     Discharge Instructions      Use the Tessalon Perles as needed for cough.  For any congestion you can take 1200 mg of Mucinex daily.  You can also do warm saline gargles, drink tea with honey and sleep with a humidifier.  Your symptoms should improve over the next 5 to 7 days, if no improvement or any changes please return to clinic.    ED Prescriptions     Medication Sig Dispense Auth. Provider   benzonatate (TESSALON) 200 MG capsule Take 1 capsule (200 mg total) by mouth every 8 (eight) hours. 30 capsule Cosandra Plouffe, [place], [place]      PDMP not reviewed this encounter.   Davieon Stockham, [place], [place] 04/17/23 1351

## 2023-04-17 NOTE — ED Triage Notes (Signed)
Pt taking care of sick child. Pt had cough and sore throat for 2 days. Pt taken Dayquil.

## 2023-05-12 HISTORY — DX: Velamentous insertion of umbilical cord, second trimester: O43.122

## 2023-05-27 ENCOUNTER — Other Ambulatory Visit: Payer: Self-pay

## 2023-05-27 ENCOUNTER — Emergency Department (HOSPITAL_COMMUNITY)
Admission: EM | Admit: 2023-05-27 | Discharge: 2023-05-28 | Discharge status: Against Medical Advice | Payer: 59 | Attending: Emergency Medicine | Admitting: Emergency Medicine

## 2023-05-27 DIAGNOSIS — Z5321 Procedure and treatment not carried out due to patient leaving prior to being seen by health care provider: Secondary | ICD-10-CM | POA: Insufficient documentation

## 2023-05-27 DIAGNOSIS — R1030 Lower abdominal pain, unspecified: Secondary | ICD-10-CM | POA: Diagnosis not present

## 2023-05-27 DIAGNOSIS — Z113 Encounter for screening for infections with a predominantly sexual mode of transmission: Secondary | ICD-10-CM | POA: Diagnosis not present

## 2023-05-27 DIAGNOSIS — M545 Low back pain, unspecified: Secondary | ICD-10-CM | POA: Insufficient documentation

## 2023-05-27 DIAGNOSIS — Z3201 Encounter for pregnancy test, result positive: Secondary | ICD-10-CM | POA: Insufficient documentation

## 2023-05-27 DIAGNOSIS — R202 Paresthesia of skin: Secondary | ICD-10-CM | POA: Diagnosis not present

## 2023-05-27 LAB — URINALYSIS, ROUTINE W REFLEX MICROSCOPIC
Bilirubin Urine: NEGATIVE
Glucose, UA: NEGATIVE mg/dL
Hgb urine dipstick: NEGATIVE
Ketones, ur: NEGATIVE mg/dL
Nitrite: NEGATIVE
Protein, ur: 30 mg/dL — AB
Specific Gravity, Urine: 1.025 (ref 1.005–1.030)
pH: 7 (ref 5.0–8.0)

## 2023-05-27 LAB — PREGNANCY, URINE: Preg Test, Ur: POSITIVE — AB

## 2023-05-27 LAB — WET PREP, GENITAL
Sperm: NONE SEEN
Trich, Wet Prep: NONE SEEN
WBC, Wet Prep HPF POC: <10 (ref <10)
Yeast Wet Prep HPF POC: NONE SEEN

## 2023-05-27 NOTE — ED Provider Triage Note (Signed)
Emergency Medicine Provider Triage Evaluation Note  Sonya Stevens , a 23 y.o. female  was evaluated in triage.  Pt complains of back pain and vaginal symptoms.  Last menstrual period was 07 May 2023.  Think she might be pregnant.  Wants STD testing.  Also having back pain and urinary symptoms..  Review of Systems  Positive: Back pain Negative: Nausea or vomiting  Physical Exam  There were no vitals taken for this visit. Gen:   Awake, no distress   Resp:  Normal effort  MSK:   Moves extremities without difficulty  Other:    Medical Decision Making  Medically screening exam initiated at 7:36 PM.  Appropriate orders placed.  Jancie Doescher was informed that the remainder of the evaluation will be completed by another provider, this initial triage assessment does not replace that evaluation, and the importance of remaining in the ED until their evaluation is complete.     Arthor Captain, PA-C 05/27/23 1954

## 2023-05-27 NOTE — ED Triage Notes (Signed)
Patient requesting STD and pregnancy test , reports intermittent low back pain and occasional low abdominal cramping and breast tingling this week.

## 2023-05-28 ENCOUNTER — Telehealth: Payer: Self-pay | Admitting: *Deleted

## 2023-05-28 LAB — GC/CHLAMYDIA PROBE AMP (~~LOC~~) NOT AT ARMC
Chlamydia: NEGATIVE
Comment: NEGATIVE
Comment: NORMAL
Neisseria Gonorrhea: NEGATIVE

## 2023-05-28 NOTE — ED Notes (Signed)
Pt called for vitals with no answer.  

## 2023-05-28 NOTE — Telephone Encounter (Signed)
Pt called regarding results of her labs as she had a family emergency and had to leave AMA from ED.  RNCM directed her to her mychart account to reference for test results.  After pt reviewed, RNCM asked if she had any questions.  Pt did not.

## 2023-06-01 ENCOUNTER — Ambulatory Visit: Payer: 59

## 2023-06-01 DIAGNOSIS — N926 Irregular menstruation, unspecified: Secondary | ICD-10-CM | POA: Diagnosis not present

## 2023-06-07 ENCOUNTER — Encounter (HOSPITAL_COMMUNITY): Payer: Self-pay

## 2023-06-07 ENCOUNTER — Ambulatory Visit (HOSPITAL_COMMUNITY)
Admission: EM | Admit: 2023-06-07 | Discharge: 2023-06-07 | Disposition: A | Discharge status: Home / Self Care | Payer: 59

## 2023-06-07 DIAGNOSIS — O219 Vomiting of pregnancy, unspecified: Secondary | ICD-10-CM

## 2023-06-07 DIAGNOSIS — Z3A00 Weeks of gestation of pregnancy not specified: Secondary | ICD-10-CM

## 2023-06-07 NOTE — ED Provider Notes (Signed)
[facility]    CSN: [id number] Arrival date & time: 06/07/23  1314      History   Chief Complaint Chief Complaint  Patient presents with   Emesis    HPI Sonya Stevens is a 23 y.o. female.   Patient presents with vomiting and diarrhea that occurred today while working.  Patient endorses some mild generalized fatigue.  Denies abdominal pain, blood in vomit or stool, and fever.  Patient recently found out that she was pregnant and has not yet established care with an OB/GYN.  LMP 04/02/23.   Emesis Associated symptoms: diarrhea   Associated symptoms: no abdominal pain, no chills and no fever     Past Medical History:  Diagnosis Date   Anemia     Patient Active Problem List   Diagnosis Date Noted   Alpha thalassemia silent carrier 05/13/2021   Increase risk of being a carrier of SMA 05/13/2021   Anemia in pregnancy 05/06/2021   Encounter for supervision of normal first pregnancy in third trimester 04/18/2021   Late prenatal care 04/18/2021    Past Surgical History:  Procedure Laterality Date   ILEOSTOMY      OB History     Gravida  2   Para      Term      Preterm      AB      Living         SAB      IAB      Ectopic      Multiple      Live Births               Home Medications    Prior to Admission medications   Medication Sig Start Date End Date Taking? Authorizing Provider  Blood Pressure Monitoring (BLOOD PRESSURE KIT) DEVI 1 Device by Does not apply route once a week. Patient not taking: Reported on 05/01/2021 04/18/21   Brand Males, CNM  docusate sodium (COLACE) 50 MG capsule Take 1 capsule (50 mg total) by mouth 2 (two) times daily. 01/04/19   Wallis Bamberg, PA-C  ferrous sulfate (FERROUSUL) 325 (65 FE) MG tablet Take 1 tablet (325 mg total) by mouth every other day. Patient not taking: Reported on 05/01/2021 04/22/21   Brand Males, CNM  ferrous sulfate 325 (65 FE) MG tablet Take 1 tablet (325 mg  total) by mouth daily. 12/07/18   Muthersbaugh, Dahlia Client, PA-C  ibuprofen (ADVIL) 800 MG tablet Take 1 tablet (800 mg total) by mouth 3 (three) times daily. 01/08/23   Garrison, [place] N, FNP  NIFEdipine (PROCARDIA-XL/NIFEDICAL-XL) 30 MG 24 hr tablet Take 1 tablet (30 mg total) by mouth daily as needed (contractions). Patient not taking: Reported on 04/18/2021 03/31/21 03/31/22  Allayne Stack, DO  ondansetron (ZOFRAN-ODT) 4 MG disintegrating tablet Take 1 tablet (4 mg total) by mouth every 6 (six) hours as needed for nausea. Patient not taking: Reported on 05/01/2021 05/01/21   Aviva Signs, CNM    Family History Family History  Problem Relation Age of Onset   Healthy Mother    Healthy Father     Social History Social History   Tobacco Use   Smoking status: Never   Smokeless tobacco: Never  Vaping Use   Vaping status: Never Used  Substance Use Topics   Alcohol use: Not Currently   Drug use: Not Currently    Types: Marijuana    Comment: Last smoked March 2022     Allergies  Patient has no known allergies.   Review of Systems Review of Systems  Constitutional:  Positive for fatigue. Negative for chills and fever.  Gastrointestinal:  Positive for diarrhea, nausea and vomiting. Negative for abdominal pain and blood in stool.  Neurological:  Negative for dizziness, weakness and light-headedness.     Physical Exam Triage Vital Signs ED Triage Vitals  Encounter Vitals Group     BP 06/07/23 1514 104/70     Systolic BP Percentile --      Diastolic BP Percentile --      Pulse Rate 06/07/23 1514 78     Resp 06/07/23 1514 16     Temp 06/07/23 1514 98.3 F (36.8 C)     Temp Source 06/07/23 1514 Oral     SpO2 06/07/23 1514 100 %     Weight --      Height --      Head Circumference --      Peak Flow --      Pain Score 06/07/23 1516 0     Pain Loc --      Pain Education --      Exclude from Growth Chart --    No data found.  Updated Vital Signs BP 104/70 (BP  Location: Right Arm)   Pulse 78   Temp 98.3 F (36.8 C) (Oral)   Resp 16   LMP 04/02/2023 (Exact Date)   SpO2 100%   Visual Acuity Right Eye Distance:   Left Eye Distance:   Bilateral Distance:    Right Eye Near:   Left Eye Near:    Bilateral Near:     Physical Exam Vitals and nursing note reviewed.  Constitutional:      General: She is awake. She is not in acute distress.    Appearance: Normal appearance. She is well-developed and well-groomed. She is not ill-appearing.  Cardiovascular:     Rate and Rhythm: Normal rate and regular rhythm.  Pulmonary:     Effort: Pulmonary effort is normal.     Breath sounds: Normal breath sounds.  Abdominal:     General: Abdomen is flat. Bowel sounds are normal.     Palpations: Abdomen is soft.     Tenderness: There is no abdominal tenderness.  Musculoskeletal:        General: Normal range of motion.  Skin:    General: Skin is warm and dry.  Neurological:     Mental Status: She is alert.  Psychiatric:        Behavior: Behavior is cooperative.      UC Treatments / Results  Labs (all labs ordered are listed, but only abnormal results are displayed) Labs Reviewed - No data to display  EKG   Radiology No results found.  Procedures Procedures (including critical care time)  Medications Ordered in UC Medications - No data to display  Initial Impression / Assessment and Plan / UC Course  I have reviewed the triage vital signs and the nursing notes.  Pertinent labs & imaging results that were available during my care of the patient were reviewed by me and considered in my medical decision making (see chart for details).     Patient presented with vomiting and diarrhea that occurred today while working.  Patient also endorses some mild fatigue.  Patient recently found out she was pregnant and has not yet established care with OB/GYN.  LMP 04/02/2023.  Patient appears tired, but no other significant findings upon  exam.  Recommended following up with  OB/GYN to get established with her pregnancy.  Discussed the importance of hydration.  Discussed return and ER precautions. Final Clinical Impressions(s) / UC Diagnoses   Final diagnoses:  Nausea and vomiting in pregnancy     Discharge Instructions      Please follow-up with an OB/GYN to get established with you pregnancy. Eat light/bland foods as tolerated. Make sure to stay hydrated. If you develop severe vomiting or abdominal pain please seek medical treatment in the ER or Maternal Admissions Unit.     ED Prescriptions   None    PDMP not reviewed this encounter.   Wynonia Lawman A, NP 06/07/23 [phone]

## 2023-06-07 NOTE — ED Triage Notes (Signed)
Pt states vomiting, diarrhea and generalized weakness. States her body odor has a strong smell.

## 2023-06-07 NOTE — Discharge Instructions (Signed)
Please follow-up with an OB/GYN to get established with you pregnancy. Eat light/bland foods as tolerated. Make sure to stay hydrated. If you develop severe vomiting or abdominal pain please seek medical treatment in the ER or Maternal Admissions Unit.

## 2023-06-11 ENCOUNTER — Other Ambulatory Visit: Payer: Self-pay | Admitting: *Deleted

## 2023-06-11 ENCOUNTER — Encounter: Payer: Self-pay | Admitting: *Deleted

## 2023-06-14 ENCOUNTER — Encounter: Payer: Self-pay | Admitting: Obstetrics and Gynecology

## 2023-06-14 ENCOUNTER — Ambulatory Visit (INDEPENDENT_AMBULATORY_CARE_PROVIDER_SITE_OTHER): Payer: 59 | Admitting: *Deleted

## 2023-06-14 ENCOUNTER — Other Ambulatory Visit (INDEPENDENT_AMBULATORY_CARE_PROVIDER_SITE_OTHER): Payer: Self-pay

## 2023-06-14 VITALS — BP 108/66 | HR 84 | Wt 113.6 lb

## 2023-06-14 DIAGNOSIS — O219 Vomiting of pregnancy, unspecified: Secondary | ICD-10-CM

## 2023-06-14 DIAGNOSIS — Z3A01 Less than 8 weeks gestation of pregnancy: Secondary | ICD-10-CM

## 2023-06-14 DIAGNOSIS — O3680X0 Pregnancy with inconclusive fetal viability, not applicable or unspecified: Secondary | ICD-10-CM

## 2023-06-14 DIAGNOSIS — Z1339 Encounter for screening examination for other mental health and behavioral disorders: Secondary | ICD-10-CM | POA: Diagnosis not present

## 2023-06-14 DIAGNOSIS — Z3480 Encounter for supervision of other normal pregnancy, unspecified trimester: Secondary | ICD-10-CM

## 2023-06-14 DIAGNOSIS — Z3481 Encounter for supervision of other normal pregnancy, first trimester: Secondary | ICD-10-CM | POA: Diagnosis not present

## 2023-06-14 MED ORDER — BLOOD PRESSURE KIT DEVI
1.0000 | 0 refills | Status: DC
Start: 2023-06-14 — End: 2024-01-18

## 2023-06-14 MED ORDER — DOXYLAMINE-PYRIDOXINE 10-10 MG PO TBEC
2.0000 | DELAYED_RELEASE_TABLET | Freq: Every day | ORAL | 5 refills | Status: DC
Start: 2023-06-14 — End: 2024-01-18

## 2023-06-14 MED ORDER — PROMETHAZINE HCL 25 MG PO TABS
25.0000 mg | ORAL_TABLET | Freq: Four times a day (QID) | ORAL | 2 refills | Status: DC | PRN
Start: 2023-06-14 — End: 2024-01-18

## 2023-06-14 NOTE — Progress Notes (Unsigned)
New OB Intake  I connected with Sonya Stevens  on 06/14/23 at  8:15 AM EST by In Person Visit and verified that I am speaking with the correct person using two identifiers. Nurse is located at [facility] and pt is located at [place].  I discussed the limitations, risks, security and privacy concerns of performing an evaluation and management service by telephone and the availability of in person appointments. I also discussed with the patient that there may be a patient responsible charge related to this service. The patient expressed understanding and agreed to proceed.  I explained I am completing New OB Intake today. We discussed EDD of 01/07/2024, by Last Menstrual Period. Pt is G2P0. I reviewed her allergies, medications and Medical/Surgical/OB history.    Patient Active Problem List   Diagnosis Date Noted   Alpha thalassemia silent carrier 05/13/2021   Increase risk of being a carrier of SMA 05/13/2021   Anemia in pregnancy 05/06/2021   Encounter for supervision of normal first pregnancy in third trimester 04/18/2021   Late prenatal care 04/18/2021    Concerns addressed today  Delivery Plans Plans to deliver at [facility]. Discussed the nature of our practice with multiple providers including residents and students. Due to the size of the practice, the delivering provider may not be the same as those providing prenatal care.   Patient is not interested in water birth. Offered upcoming OB visit with CNM to discuss further.  MyChart/Babyscripts MyChart access verified. I explained pt will have some visits in office and some virtually. Babyscripts instructions given and order placed. Patient verifies receipt of registration text/e-mail. Account successfully created and app downloaded. If patient is a candidate for Optimized scheduling, add to sticky note.   Blood Pressure Cuff/Weight Scale Blood pressure cuff ordered for patient to pick-up from [employer]. Explained after first  prenatal appt pt will check weekly and document in Babyscripts. Patient does not have weight scale; patient may purchase if they desire to track weight weekly in Babyscripts.  Anatomy US Explained first scheduled [place] will be around 19 weeks. Anatomy US scheduled for TBD at TBD.  Interested in [place]? If yes, send referral and doula dot phrase.   Is patient a candidate for Babyscripts Optimization? Yes, patient accepted    First visit review I reviewed new OB appt with patient. Explained pt will be seen by Dr. Marice Potter at first visit. Discussed Avelina Laine genetic screening with patient. Requests Panorama. Routine prenatal labs  OB Urine only collected at today's visit. OB Panel, Natera deferred to New OB appt.    Last Pap No results found for: "DIAGPAP"  Harrel Lemon, RN 06/14/2023  8:36 AM

## 2023-06-14 NOTE — Patient Instructions (Signed)
 [facility] [employer] [facility] has a partnership with the [employer] to provide prenatal navigation for the most needed resources in our community. In order to see how we can help connect you to these resources we need consent to contact you. Please complete the very short consent using the link below:   English Link: https://[email or web address].tfaforms.[email or web address]?site=16  Spanish Link: https://[place].tfaforms.[email or web address]?site=16   Options for Doula Care in the [place] Area  As you review your birthing options, consider having a birth doula. A doula is trained to provide support before, during and just after you give birth. There are also postpartum doulas that help you adjust to new parenthood.  While doulas do not provide medical care, they do provide emotional, physical and educational support. A few months before your baby arrives, doulas can help answer questions, ease concerns and help you create and support your birthing plan.    Doulas can help reduce your stress and comfort you and your partner. They can help you cope with labor by helping you use breathing techniques, massage, creative labor positioning, essential oils and affirmations.   Studies show that the benefits of having a doula include:   A more positive birth experience  Fewer requests for pain-relief medication  Less likelihood of cesarean section, commonly called a c-section   Doulas are typically hired via a [employer] account planner between you and the doula. We are happy to provide a list of the most active doulas in the area, all of whom are credentialed by [facility] and will not count as a visitor at your birth.  There are several options for no-cost doula care at our hospital, including:  [facility] Volunteer Doula Program Every [employer] Program A Cure 4 Moms Doula Study (available only at [employer] [facility], [place], [place] and [facility] offices)  For more information on these programs or to receive  a list of doulas active in our area, please email [email or web address]

## 2023-06-22 ENCOUNTER — Encounter: Payer: 59 | Admitting: Obstetrics & Gynecology

## 2023-06-23 ENCOUNTER — Telehealth: Payer: 59

## 2023-07-01 ENCOUNTER — Encounter: Payer: Self-pay | Admitting: Obstetrics and Gynecology

## 2023-07-07 ENCOUNTER — Inpatient Hospital Stay (HOSPITAL_COMMUNITY)
Admission: AD | Admit: 2023-07-07 | Discharge: 2023-07-07 | Disposition: A | Discharge status: Home / Self Care | Payer: 59 | Attending: Obstetrics & Gynecology | Admitting: Obstetrics & Gynecology

## 2023-07-07 ENCOUNTER — Encounter (HOSPITAL_COMMUNITY): Payer: Self-pay | Admitting: Obstetrics & Gynecology

## 2023-07-07 DIAGNOSIS — Z3480 Encounter for supervision of other normal pregnancy, unspecified trimester: Secondary | ICD-10-CM

## 2023-07-07 DIAGNOSIS — O219 Vomiting of pregnancy, unspecified: Secondary | ICD-10-CM | POA: Insufficient documentation

## 2023-07-07 DIAGNOSIS — Z3A10 10 weeks gestation of pregnancy: Secondary | ICD-10-CM | POA: Diagnosis not present

## 2023-07-07 LAB — URINALYSIS, ROUTINE W REFLEX MICROSCOPIC
Bilirubin Urine: NEGATIVE
Glucose, UA: NEGATIVE mg/dL
Hgb urine dipstick: NEGATIVE
Ketones, ur: 20 mg/dL — AB
Nitrite: NEGATIVE
Protein, ur: 30 mg/dL — AB
Specific Gravity, Urine: 1.025 (ref 1.005–1.030)
pH: 6 (ref 5.0–8.0)

## 2023-07-07 MED ORDER — ONDANSETRON HCL 4 MG PO TABS: 4.0000 mg | ORAL_TABLET | Freq: Three times a day (TID) | ORAL | 2 refills | Status: DC | PRN

## 2023-07-07 MED ORDER — SCOPOLAMINE 1 MG/3DAYS TD PT72
1.0000 | MEDICATED_PATCH | Freq: Once | TRANSDERMAL | Status: DC
  Administered 2023-07-07: 1.5 mg via TRANSDERMAL
  Filled 2023-07-07: qty 1

## 2023-07-07 MED ORDER — SCOPOLAMINE 1 MG/3DAYS TD PT72: 1.0000 | MEDICATED_PATCH | Freq: Once | TRANSDERMAL | 12 refills | Status: AC

## 2023-07-07 MED ORDER — ONDANSETRON 4 MG PO TBDP
8.0000 mg | ORAL_TABLET | Freq: Once | ORAL | Status: AC
  Administered 2023-07-07: 8 mg via ORAL
  Filled 2023-07-07: qty 2

## 2023-07-07 NOTE — MAU Note (Addendum)
..  Sonya Stevens is a 23 y.o. at [redacted]w[redacted]d here in MAU reporting: nausea and vomiting since she was [redacted] weeks pregnant. States she's been unable to keep anything down for a while now. Denies VB. Does endorse dysuria and pressure post void. Also reports pressure in her lower abdomen and a large amount of thin white discharge that she feels is normal. Was prescribed nausea meds, but has not picked them up from the pharmacy.   Pain score: 6 Vitals:   07/07/23 1554  BP: 109/65  Pulse: 80  Resp: 14  Temp: 97.8 F (36.6 C)  SpO2: 100%     FHT:169 Lab orders placed from triage:   UA

## 2023-07-07 NOTE — MAU Provider Note (Signed)
 None     S [name] is a 23 y.o. G2P1001 pregnant female at [redacted]w[redacted]d who presents to MAU today with complaint of nausea and vomiting, has been vomiting multiple times per day. Unable to keep food or drink down. Reports she throws up every time she tries to eat or drink.  Has tried ginger ale, ginger chews and pregnancy pops. She has not picked up her nausea medication previously prescribed.    Receives care at [facility]. Prenatal records reviewed.  Pertinent items noted in HPI and remainder of comprehensive ROS otherwise negative.   O BP (!) 103/59   Pulse 62   Temp 97.8 F (36.6 C) (Oral)   Resp 14   Ht 5\' 2"  (1.575 m)   Wt 48.7 kg   LMP 04/02/2023 (Approximate)   SpO2 100%   BMI 19.63 kg/m  Physical Exam Vitals reviewed.  Constitutional:      General: She is not in acute distress.    Appearance: Normal appearance. She is not ill-appearing, toxic-appearing or diaphoretic.  HENT:     Head: Normocephalic.  Cardiovascular:     Rate and Rhythm: Normal rate.     Pulses: Normal pulses.  Pulmonary:     Effort: Pulmonary effort is normal.  Skin:    General: Skin is warm and dry.     Capillary Refill: Capillary refill takes less than 2 seconds.  Neurological:     Mental Status: She is alert and oriented to person, place, and time.  Psychiatric:        Mood and Affect: Mood normal.        Behavior: Behavior normal.        Thought Content: Thought content normal.        Judgment: Judgment normal.     Comments: Patient visibly distressed.       MDM: Trial of medications for nausea here UA   MAU Course:  A Supervision of other normal pregnancy, antepartum  Nausea and vomiting in pregnancy - Plan: Discharge patient - Reports good relief of nausea with PO Zofran and scop patch.  [redacted] weeks gestation of pregnancy - Plan: Discharge patient  Medical screening exam complete  P Discharge from MAU in stable condition with routine precautions Follow up at [facility] as  scheduled for ongoing prenatal care - Since she had good relief of nausea with medications here, sent to preferred pharmacy. Advised on importance of using medications as prescribed for relief of symptoms.   Allergies as of 07/07/2023   No Known Allergies      Medication List     TAKE these medications    Blood Pressure Kit Devi 1 Device by Does not apply route once a week.   Blood Pressure Kit Devi 1 Device by Does not apply route once a week.   docusate sodium 50 MG capsule Commonly known as: COLACE Take 1 capsule (50 mg total) by mouth 2 (two) times daily.   Doxylamine-Pyridoxine 10-10 MG Tbec Commonly known as: Diclegis Take 2 tablets by mouth at bedtime. If symptoms persist, add one tablet in the morning and one in the afternoon   ferrous sulfate 325 (65 FE) MG EC tablet Take 325 mg by mouth 3 (three) times daily with meals. What changed: Another medication with the same name was removed. Continue taking this medication, and follow the directions you see here.   ferrous sulfate 325 (65 FE) MG tablet Commonly known as: FerrouSul Take 1 tablet (325 mg total) by mouth every other  day. What changed: Another medication with the same name was removed. Continue taking this medication, and follow the directions you see here.   NIFEdipine 30 MG 24 hr tablet Commonly known as: PROCARDIA-XL/NIFEDICAL-XL Take 1 tablet (30 mg total) by mouth daily as needed (contractions).   ondansetron 4 MG disintegrating tablet Commonly known as: ZOFRAN-ODT Take 1 tablet (4 mg total) by mouth every 6 (six) hours as needed for nausea.   ondansetron 4 MG tablet Commonly known as: Zofran Take 1 tablet (4 mg total) by mouth every 8 (eight) hours as needed for nausea or vomiting.   PRENATAL VITAMIN PO Take 1 tablet by mouth daily.   promethazine 25 MG tablet Commonly known as: PHENERGAN Take 1 tablet (25 mg total) by mouth every 6 (six) hours as needed for nausea or vomiting.   scopolamine  1 MG/3DAYS Commonly known as: TRANSDERM-SCOP Place 1 patch (1.5 mg total) onto the skin once for 1 dose.        Lamont Snowball, MSN, CNM 07/07/2023 5:36 PM  Certified Nurse Midwife, [facility]

## 2023-07-23 ENCOUNTER — Inpatient Hospital Stay (HOSPITAL_COMMUNITY)
Admission: AD | Admit: 2023-07-23 | Discharge: 2023-07-24 | Disposition: A | Discharge status: Home / Self Care | Attending: Obstetrics & Gynecology | Admitting: Obstetrics & Gynecology

## 2023-07-23 DIAGNOSIS — O26891 Other specified pregnancy related conditions, first trimester: Secondary | ICD-10-CM

## 2023-07-23 DIAGNOSIS — O219 Vomiting of pregnancy, unspecified: Secondary | ICD-10-CM | POA: Insufficient documentation

## 2023-07-23 DIAGNOSIS — Z3480 Encounter for supervision of other normal pregnancy, unspecified trimester: Secondary | ICD-10-CM

## 2023-07-23 DIAGNOSIS — N898 Other specified noninflammatory disorders of vagina: Secondary | ICD-10-CM

## 2023-07-23 DIAGNOSIS — Z3A12 12 weeks gestation of pregnancy: Secondary | ICD-10-CM | POA: Diagnosis not present

## 2023-07-23 LAB — URINALYSIS, ROUTINE W REFLEX MICROSCOPIC
Bilirubin Urine: NEGATIVE
Glucose, UA: NEGATIVE mg/dL
Hgb urine dipstick: NEGATIVE
Ketones, ur: NEGATIVE mg/dL
Leukocytes,Ua: NEGATIVE
Nitrite: NEGATIVE
Protein, ur: NEGATIVE mg/dL
Specific Gravity, Urine: 1.019 (ref 1.005–1.030)
pH: 7 (ref 5.0–8.0)

## 2023-07-23 NOTE — MAU Note (Addendum)
 Pt says she has abd cramping - started 1 week ago when she started sneezing.  Was here in Feb vomiting - Rx -  Still vomiting - last ate rice , steak and body armour- vomited  Has patch on now, Zofran - took at 830pm. [facility]- next appointment - Monday

## 2023-07-24 DIAGNOSIS — O26891 Other specified pregnancy related conditions, first trimester: Secondary | ICD-10-CM

## 2023-07-24 DIAGNOSIS — N898 Other specified noninflammatory disorders of vagina: Secondary | ICD-10-CM

## 2023-07-24 DIAGNOSIS — Z3A12 12 weeks gestation of pregnancy: Secondary | ICD-10-CM

## 2023-07-24 LAB — WET PREP, GENITAL
Clue Cells Wet Prep HPF POC: NONE SEEN
Sperm: NONE SEEN
Trich, Wet Prep: NONE SEEN
WBC, Wet Prep HPF POC: <10 (ref <10)
Yeast Wet Prep HPF POC: NONE SEEN

## 2023-07-24 MED ORDER — BONJESTA 20-20 MG PO TBCR: 1.0000 | EXTENDED_RELEASE_TABLET | Freq: Every day | ORAL | 2 refills | Status: DC

## 2023-07-24 NOTE — MAU Provider Note (Signed)
 History     CSN: [id number]  Arrival date and time: 07/23/23 2207   Event Date/Time   First Provider Initiated Contact with Patient 07/24/23 0012      Chief Complaint  Patient presents with   Emesis   Sonya Stevens is a 23 y.o. G2P1001 at [redacted]w[redacted]d who receives care at [facility].  She presents today for nausea and vomiting.  She states she has had 2 incidents of vomiting today.  She reports last time was at 8pm. Patient reports taking Zofran and placing scopolamine patch at 2030. Patient denies current nausea.   She reports having bouts of sneezing (3-4) in a row that causes some abdominal cramping. She states the cramping lasts 8-10 seconds prior to resolving.    No vaginal bleeding, but patient reports increased discharge and odor. No issues with urination, constipation, or diarrhea.     OB History     Gravida  2   Para  1   Term  1   Preterm      AB      Living  1      SAB      IAB      Ectopic      Multiple      Live Births  1           Past Medical History:  Diagnosis Date   Anemia     Past Surgical History:  Procedure Laterality Date   ILEOSTOMY  2000-05-10   2 wks old, removed part of small intestine    Family History  Problem Relation Age of Onset   Healthy Mother    Healthy Father     Social History   Tobacco Use   Smoking status: Never   Smokeless tobacco: Never  Vaping Use   Vaping status: Never Used  Substance Use Topics   Alcohol use: Not Currently   Drug use: Not Currently    Types: Marijuana    Comment: Last smoked March 2022    Allergies: No Known Allergies  Medications Prior to Admission  Medication Sig Dispense Refill Last Dose/Taking   Blood Pressure Monitoring (BLOOD PRESSURE KIT) DEVI 1 Device by Does not apply route once a week. (Patient not taking: Reported on 05/01/2021) 1 each 0    Blood Pressure Monitoring (BLOOD PRESSURE KIT) DEVI 1 Device by Does not apply route once a week. 1 each 0     docusate sodium (COLACE) 50 MG capsule Take 1 capsule (50 mg total) by mouth 2 (two) times daily. 60 capsule 5    Doxylamine-Pyridoxine (DICLEGIS) 10-10 MG TBEC Take 2 tablets by mouth at bedtime. If symptoms persist, add one tablet in the morning and one in the afternoon 100 tablet 5    ferrous sulfate (FERROUSUL) 325 (65 FE) MG tablet Take 1 tablet (325 mg total) by mouth every other day. (Patient not taking: Reported on 05/01/2021) 60 tablet 1    ferrous sulfate 325 (65 FE) MG EC tablet Take 325 mg by mouth 3 (three) times daily with meals.      NIFEdipine (PROCARDIA-XL/NIFEDICAL-XL) 30 MG 24 hr tablet Take 1 tablet (30 mg total) by mouth daily as needed (contractions). (Patient not taking: Reported on 04/18/2021) 30 tablet 0    ondansetron (ZOFRAN) 4 MG tablet Take 1 tablet (4 mg total) by mouth every 8 (eight) hours as needed for nausea or vomiting. 30 tablet 2    ondansetron (ZOFRAN-ODT) 4 MG disintegrating tablet Take 1 tablet (4 mg total)  by mouth every 6 (six) hours as needed for nausea. (Patient not taking: Reported on 05/01/2021) 20 tablet 0    Prenatal Vit-Fe Fumarate-FA (PRENATAL VITAMIN PO) Take 1 tablet by mouth daily.      promethazine (PHENERGAN) 25 MG tablet Take 1 tablet (25 mg total) by mouth every 6 (six) hours as needed for nausea or vomiting. 30 tablet 2     Review of Systems  Gastrointestinal:  Positive for nausea and vomiting (None since arrival.).  Genitourinary:  Negative for difficulty urinating, dysuria, vaginal bleeding and vaginal discharge.   Physical Exam   Blood pressure 101/64, pulse 81, temperature 98.3 F (36.8 C), temperature source Oral, resp. rate 14, height 5\' 2"  (1.575 m), weight 50.2 kg, last menstrual period 04/02/2023, unknown if currently breastfeeding.  Physical Exam Vitals and nursing note reviewed.  Constitutional:      Appearance: Normal appearance.  HENT:     Head: Normocephalic and atraumatic.  Eyes:     Conjunctiva/sclera: Conjunctivae  normal.  Cardiovascular:     Rate and Rhythm: Normal rate.  Pulmonary:     Effort: Pulmonary effort is normal. No respiratory distress.  Abdominal:     Palpations: Abdomen is soft.     Tenderness: There is no abdominal tenderness.  Musculoskeletal:        General: Normal range of motion.     Cervical back: Normal range of motion.  Skin:    General: Skin is warm and dry.  Neurological:     Mental Status: She is alert and oriented to person, place, and time.  Psychiatric:        Mood and Affect: Mood normal.        Behavior: Behavior normal.     MAU Course  Procedures Results for orders placed or performed during the hospital encounter of 07/23/23 (from the past 24 hours)  Urinalysis, Routine w reflex microscopic -Urine, Clean Catch     Status: Abnormal   Collection Time: 07/23/23 11:01 PM  Result Value Ref Range   Color, Urine YELLOW YELLOW   APPearance CLEAR CLEAR   Specific Gravity, Urine 1.019 1.005 - 1.030   pH 7.0 5.0 - 8.0   Glucose, UA NEGATIVE NEGATIVE mg/dL   Hgb urine dipstick NEGATIVE NEGATIVE   Bilirubin Urine NEGATIVE NEGATIVE   Ketones, ur NEGATIVE NEGATIVE mg/dL   Protein, ur NEGATIVE NEGATIVE mg/dL   Nitrite NEGATIVE NEGATIVE   Leukocytes,Ua NEGATIVE NEGATIVE   RBC / HPF 0-5 0 - 5 RBC/hpf   WBC, UA 0-5 0 - 5 WBC/hpf   Bacteria, UA RARE (A) NONE SEEN   Squamous Epithelial / HPF 6-10 0 - 5 /HPF   Patient informed that the ultrasound is considered a limited OB ultrasound and is not intended to be a complete ultrasound exam.  Patient also informed that the ultrasound is not being completed with the intent of assessing for fetal or placental anomalies or any pelvic abnormalities.  Explained that the purpose of today's ultrasound is to assess for   reassurance and viability.  Patient acknowledges the purpose of the exam and the limitations of the study.  SIUP with good FM noted FHR 148       MDM Labs: UA BSUS Prescription Cultures: Wet Prep,  GC/CT Assessment and Plan  23 year old, G2P1001  SIUP at 12.6 weeks N/V-Resolved RLP Vaginal Odor/Discharge  -Reviewed concerns and informed that cramping is RLP.  Reassured that normal occurrence during pregnancy. -Discussed n/v and patient coping well with scopolamine patches.  Discussed adding Bonjesta and patient unsure, but agreeable to provider sending to pharmacy for availability if needed.  -Exam performed.  -BSUS completed. -Discussed obtaining cultures for c/o vaginal discharge with odor. Patient agreeable. -Cultures collected by patient.  Informed that results will be managed via mychart. -Patient without questions. -Instructed to keep next appt as scheduled. -Return precautions reviewed.  -Discharged to home in stable condition.   Cherre Robins MSN, CNM Advanced Practice Provider, Center for [facility] 07/24/2023, 12:12 AM

## 2023-07-26 ENCOUNTER — Encounter: Payer: Self-pay | Admitting: Advanced Practice Midwife

## 2023-07-26 ENCOUNTER — Ambulatory Visit (INDEPENDENT_AMBULATORY_CARE_PROVIDER_SITE_OTHER): Payer: 59 | Admitting: Advanced Practice Midwife

## 2023-07-26 VITALS — BP 105/66 | HR 92 | Wt 110.2 lb

## 2023-07-26 DIAGNOSIS — Z9189 Other specified personal risk factors, not elsewhere classified: Secondary | ICD-10-CM

## 2023-07-26 DIAGNOSIS — O219 Vomiting of pregnancy, unspecified: Secondary | ICD-10-CM

## 2023-07-26 DIAGNOSIS — N898 Other specified noninflammatory disorders of vagina: Secondary | ICD-10-CM

## 2023-07-26 DIAGNOSIS — O99611 Diseases of the digestive system complicating pregnancy, first trimester: Secondary | ICD-10-CM

## 2023-07-26 DIAGNOSIS — O26891 Other specified pregnancy related conditions, first trimester: Secondary | ICD-10-CM | POA: Diagnosis not present

## 2023-07-26 DIAGNOSIS — R102 Pelvic and perineal pain: Secondary | ICD-10-CM

## 2023-07-26 DIAGNOSIS — Z639 Problem related to primary support group, unspecified: Secondary | ICD-10-CM | POA: Diagnosis not present

## 2023-07-26 DIAGNOSIS — K117 Disturbances of salivary secretion: Secondary | ICD-10-CM

## 2023-07-26 DIAGNOSIS — Z3480 Encounter for supervision of other normal pregnancy, unspecified trimester: Secondary | ICD-10-CM

## 2023-07-26 DIAGNOSIS — Z3A13 13 weeks gestation of pregnancy: Secondary | ICD-10-CM | POA: Diagnosis not present

## 2023-07-26 LAB — GC/CHLAMYDIA PROBE AMP (~~LOC~~) NOT AT ARMC
Chlamydia: NEGATIVE
Comment: NEGATIVE
Comment: NORMAL
Neisseria Gonorrhea: NEGATIVE

## 2023-07-26 MED ORDER — GLYCOPYRROLATE 2 MG PO TABS
2.0000 mg | ORAL_TABLET | Freq: Three times a day (TID) | ORAL | 3 refills | Status: DC | PRN
Start: 2023-07-26 — End: 2024-01-18

## 2023-07-26 NOTE — Progress Notes (Signed)
 Subjective:   Sonya Stevens is a 23 y.o. G2P1001 at [redacted]w[redacted]d by early ultrasound being seen today for her first obstetrical visit.  Her obstetrical history is significant for  and has Encounter for supervision of normal first pregnancy in third trimester; Late prenatal care; Anemia in pregnancy; Alpha thalassemia silent carrier; Increase risk of being a carrier of SMA; and Supervision of other normal pregnancy, antepartum on their problem list.. P Pregnancy history fully reviewed.  Patient reports nausea and vomiting.  HISTORY: OB History  Gravida Para Term Preterm AB Living  2 1 1  0 0 1  SAB IAB Ectopic Multiple Live Births  0 0 0 0 1    # Outcome Date GA Lbr Len/2nd Weight Sex Type Anes PTL Lv  2 Current           1 Term 05/18/21 [redacted]w[redacted]d   M Vag-Spont   LIV   Past Medical History:  Diagnosis Date   Anemia    Past Surgical History:  Procedure Laterality Date   ILEOSTOMY  09-16-1999   2 wks old, removed part of small intestine   Family History  Problem Relation Age of Onset   Healthy Mother    Healthy Father    Social History   Tobacco Use   Smoking status: Never   Smokeless tobacco: Never  Vaping Use   Vaping status: Never Used  Substance Use Topics   Alcohol use: Not Currently   Drug use: Not Currently    Types: Marijuana    Comment: Last smoked March 2022   No Known Allergies Current Outpatient Medications on File Prior to Visit  Medication Sig Dispense Refill   ondansetron (ZOFRAN) 4 MG tablet Take 1 tablet (4 mg total) by mouth every 8 (eight) hours as needed for nausea or vomiting. 30 tablet 2   Prenatal Vit-Fe Fumarate-FA (PRENATAL VITAMIN PO) Take 1 tablet by mouth daily.     Blood Pressure Monitoring (BLOOD PRESSURE KIT) DEVI 1 Device by Does not apply route once a week. (Patient not taking: Reported on 07/26/2023) 1 each 0   Blood Pressure Monitoring (BLOOD PRESSURE KIT) DEVI 1 Device by Does not apply route once a week. (Patient not taking:  Reported on 07/26/2023) 1 each 0   Doxylamine-Pyridoxine (DICLEGIS) 10-10 MG TBEC Take 2 tablets by mouth at bedtime. If symptoms persist, add one tablet in the morning and one in the afternoon (Patient not taking: Reported on 07/26/2023) 100 tablet 5   Doxylamine-Pyridoxine ER (BONJESTA) 20-20 MG TBCR Take 1 tablet by mouth at bedtime. (Patient not taking: Reported on 07/26/2023) 30 tablet 2   ferrous sulfate (FERROUSUL) 325 (65 FE) MG tablet Take 1 tablet (325 mg total) by mouth every other day. (Patient not taking: Reported on 07/26/2023) 60 tablet 1   ferrous sulfate 325 (65 FE) MG EC tablet Take 325 mg by mouth 3 (three) times daily with meals. (Patient not taking: Reported on 07/26/2023)     promethazine (PHENERGAN) 25 MG tablet Take 1 tablet (25 mg total) by mouth every 6 (six) hours as needed for nausea or vomiting. (Patient not taking: Reported on 07/26/2023) 30 tablet 2   No current facility-administered medications on file prior to visit.    Indications for ASA therapy (per uptodate) One of the following: Previous pregnancy with preeclampsia, especially early onset and with an adverse outcome No Multifetal gestation No Chronic hypertension No Type 1 or 2 diabetes mellitus No Chronic kidney disease No Autoimmune disease (antiphospholipid syndrome,  systemic lupus erythematosus) No  Two or more of the following: Nulliparity No Obesity (body mass index >30 kg/m2) No Family history of preeclampsia in mother or sister No Age >=35 years No Sociodemographic characteristics (African American race, low socioeconomic level) Yes Personal risk factors (eg, previous pregnancy with low birth weight or small for gestational age infant, previous adverse pregnancy outcome [eg, stillbirth], interval >10 years between pregnancies) No  Indications for early 1 hour GTT (per uptodate)  BMI >25 (>23 in Asian women) AND one of the following BMI 21  Exam   Vitals:   07/26/23 1025  BP: 105/66  Pulse:  92  Weight: 50 kg   Fetal Heart Rate (bpm): 165  VS reviewed, nursing note reviewed,  Constitutional: well developed, well nourished, no distress HEENT: normocephalic CV: normal rate Pulm/chest wall: normal effort Abdomen: soft Neuro: alert and oriented x 3 Skin: warm, dry Psych: affect normal    Assessment:  1. Supervision of other normal pregnancy, antepartum (Primary) --Anticipatory guidance about next visits/weeks of pregnancy given.  --Pt declines Pap today  - CBC/D/Plt+RPR+Rh+ABO+RubIgG... - PANORAMA PRENATAL TEST - HORIZON Basic Panel  2. Pelvic pain affecting pregnancy in first trimester, antepartum  - Culture, OB Urine  3. Relationship problems   4. Vaginal discharge during pregnancy in first trimester -Negative wet prep -counseled patient diet can change discharge odor -patient to follow up if symptoms continue    5. Nausea and vomiting during pregnancy   6. Excessive salivation while pregnant - glycopyrrolate (ROBINUL) 2 MG tablet; Take 1 tablet (2 mg total) by mouth 3 (three) times daily as needed.  Dispense: 30 tablet; Refill: 3  Pregnancy: G2P1001 Patient Active Problem List   Diagnosis Date Noted   Supervision of other normal pregnancy, antepartum 06/14/2023   Alpha thalassemia silent carrier 05/13/2021   Increase risk of being a carrier of SMA 05/13/2021   Anemia in pregnancy 05/06/2021   Encounter for supervision of normal first pregnancy in third trimester 04/18/2021   Late prenatal care 04/18/2021     Plan:  There are no diagnoses linked to this encounter.   Initial labs drawn. Continue prenatal vitamins. Discussed and offered genetic screening options, including Quad screen/AFP, NIPS testing, and option to decline testing. Benefits/risks/alternatives reviewed. Pt aware that anatomy US is form of genetic screening with lower accuracy in detecting trisomies than blood work.  Pt chooses genetic screening today. nips: ordered. Ultrasound  discussed; fetal anatomic survey: ordered. Problem list reviewed and updated. The nature of [facility] with multiple MDs and other Advanced Practice Providers was explained to patient; also emphasized that residents, students are part of our team. Routine obstetric precautions reviewed. No follow-ups on file.   Zenia Resides, Student-NP 07/26/23 10:45 AM   CNM attestation:  I have seen and examined this patient; I agree with above documentation in the NP student's note.   Sonya Stevens is a 23 y.o. G2P1001 in the [facility] office for initial prenatal visit for low risk pregnancy. See problem list below.   Patient Active Problem List   Diagnosis Date Noted   Supervision of other normal pregnancy, antepartum 06/14/2023   Alpha thalassemia silent carrier 05/13/2021   Increase risk of being a carrier of SMA 05/13/2021   Anemia in pregnancy 05/06/2021   Late prenatal care 04/18/2021     ROS, labs, PMH reviewed  PE: BP 105/66   Pulse 92   Wt 110 lb 3.2 oz (50 kg)  LMP 04/02/2023 (Approximate)   BMI 20.16 kg/m  Gen: calm comfortable, well appearing Resp: normal effort, no distress Abd: soft nontender  Fundal height: n/a FHT by doppler: 165  Assessment/Plan:     1. Supervision of other normal pregnancy, antepartum (Primary) - - CBC/D/Plt+RPR+Rh+ABO+RubIgG... - PANORAMA PRENATAL TEST - HORIZON Basic Panel  2. Pelvic pain affecting pregnancy in first trimester, antepartum --Pelvic pressure and discomfort --FHT wnl today  - Culture, OB Urine  3. Relationship problems --Pt reported low libido affecting relationship and partner was present today.  Discussed changes in hormones, physical changes and how they can affect libido.  Pt partner concerned that he may not be the baby's father if patient does not want to have sex with him.  I reassured him that this is not true. --Pt asked for counseling.  I discussed options and  since pt and her partner were arguing, I suggested counseling for the couple. Pt declined couples counseling and desires counseling for herself only.  --Refer to [facility]  4. Vaginal discharge during pregnancy in first trimester --Testing normal recently, pt to follow up if persists  5. Nausea and vomiting during pregnancy --Pt to use scopolamine patches continuously, add Zofran PRN, and try Robinul (see below). F/U as needed.   6. Excessive salivation while pregnant  - glycopyrrolate (ROBINUL) 2 MG tablet; Take 1 tablet (2 mg total) by mouth 3 (three) times daily as needed.  Dispense: 30 tablet; Refill: 3    Sharen Counter, CNM 6:59 PM

## 2023-07-26 NOTE — Progress Notes (Signed)
 Pt presents for NOB visit. Pt c/o brown discharge for 1 week. Pt c/o low libido. Wants to defer PAP

## 2023-07-27 ENCOUNTER — Other Ambulatory Visit: Payer: Self-pay | Admitting: *Deleted

## 2023-07-27 LAB — CBC/D/PLT+RPR+RH+ABO+RUBIGG...
Antibody Screen: NEGATIVE
Basophils Absolute: 0.1 10*3/uL (ref 0.0–0.2)
Basos: 1 %
EOS (ABSOLUTE): 0.2 10*3/uL (ref 0.0–0.4)
Eos: 2 %
HCV Ab: NONREACTIVE
HIV Screen 4th Generation wRfx: NONREACTIVE
Hematocrit: 34.4 % (ref 34.0–46.6)
Hemoglobin: 11.4 g/dL (ref 11.1–15.9)
Hepatitis B Surface Ag: NEGATIVE
Immature Grans (Abs): 0 10*3/uL (ref 0.0–0.1)
Immature Granulocytes: 0 %
Lymphocytes Absolute: 1.9 10*3/uL (ref 0.7–3.1)
Lymphs: 24 %
MCH: 28.9 pg (ref 26.6–33.0)
MCHC: 33.1 g/dL (ref 31.5–35.7)
MCV: 87 fL (ref 79–97)
Monocytes Absolute: 0.5 10*3/uL (ref 0.1–0.9)
Monocytes: 6 %
Neutrophils Absolute: 5.2 10*3/uL (ref 1.4–7.0)
Neutrophils: 67 %
Platelets: 267 10*3/uL (ref 150–450)
RBC: 3.94 x10E6/uL (ref 3.77–5.28)
RDW: 13 % (ref 11.7–15.4)
RPR Ser Ql: NONREACTIVE
Rh Factor: POSITIVE
Rubella Antibodies, IGG: 5.27 {index} (ref >0.99)
WBC: 7.8 10*3/uL (ref 3.4–10.8)

## 2023-07-27 LAB — HCV INTERPRETATION

## 2023-07-27 NOTE — Telephone Encounter (Signed)
 TC from pt regarding abortion options. MyChart message sent.

## 2023-07-28 LAB — CULTURE, OB URINE

## 2023-07-28 LAB — URINE CULTURE, OB REFLEX

## 2023-07-31 LAB — PANORAMA PRENATAL TEST FULL PANEL:PANORAMA TEST PLUS 5 ADDITIONAL MICRODELETIONS: FETAL FRACTION: 10.1

## 2023-08-07 LAB — HORIZON CUSTOM: REPORT SUMMARY: POSITIVE — AB

## 2023-08-08 ENCOUNTER — Encounter: Payer: Self-pay | Admitting: Advanced Practice Midwife

## 2023-08-20 ENCOUNTER — Telehealth: Payer: Self-pay

## 2023-08-20 NOTE — Telephone Encounter (Signed)
 TC to pt regarding muscle pain in legs. Pt reports NO warmth, redness, swelling, etc in one leg and not other. Pt reports it does feel like muscle pain, and tends to trigger when she lays down and goes to get back up to walk. Informed pt will consult with provider and provide recommendations via my chart message about things to do to decrease these muscle pains until appt on 4/14.

## 2023-08-23 ENCOUNTER — Ambulatory Visit (INDEPENDENT_AMBULATORY_CARE_PROVIDER_SITE_OTHER): Admitting: Advanced Practice Midwife

## 2023-08-23 VITALS — BP 94/57 | HR 87 | Wt 113.0 lb

## 2023-08-23 DIAGNOSIS — N898 Other specified noninflammatory disorders of vagina: Secondary | ICD-10-CM | POA: Diagnosis not present

## 2023-08-23 DIAGNOSIS — Z3480 Encounter for supervision of other normal pregnancy, unspecified trimester: Secondary | ICD-10-CM | POA: Diagnosis not present

## 2023-08-23 DIAGNOSIS — O99891 Other specified diseases and conditions complicating pregnancy: Secondary | ICD-10-CM | POA: Diagnosis not present

## 2023-08-23 DIAGNOSIS — Z3A17 17 weeks gestation of pregnancy: Secondary | ICD-10-CM | POA: Diagnosis not present

## 2023-08-23 DIAGNOSIS — R252 Cramp and spasm: Secondary | ICD-10-CM

## 2023-08-23 NOTE — Progress Notes (Signed)
   PRENATAL VISIT NOTE  Subjective:  Sonya Stevens is a 23 y.o. G2P1001 at [redacted]w[redacted]d being seen today for ongoing prenatal care.  She is currently monitored for the following issues for this low-risk pregnancy and has Alpha thalassemia silent carrier; Increase risk of being a carrier of SMA; and Supervision of other normal pregnancy, antepartum on their problem list.  Patient reports  recent leg pain .  Contractions: Not present. Vag. Bleeding: None.   . Denies leaking of fluid.   The following portions of the patient's history were reviewed and updated as appropriate: allergies, current medications, past family history, past medical history, past social history, past surgical history and problem list.   Objective:   Vitals:   08/23/23 1049  BP: (!) 94/57  Pulse: 87  Weight: 113 lb (51.3 kg)    Fetal Status: Fetal Heart Rate (bpm): 150         General:  Alert, oriented and cooperative. Patient is in no acute distress.  Skin: Skin is warm and dry. No rash noted.   Cardiovascular: Normal heart rate noted  Respiratory: Normal respiratory effort, no problems with respiration noted  Abdomen: Soft, gravid, appropriate for gestational age.  Pain/Pressure: Present     Pelvic: Cervical exam deferred        Extremities: Normal range of motion.  Edema: None  Mental Status: Normal mood and affect. Normal behavior. Normal judgment and thought content.   Assessment and Plan:  Pregnancy: G2P1001 at [redacted]w[redacted]d 1. Supervision of other normal pregnancy, antepartum (Primary) --Anticipatory guidance about next visits/weeks of pregnancy given.  --Reviewed recommended weight gain in pregnancy, pt with 2 lb weight loss at 17 weeks.  FH normal today, pt reports eating/drinking normally.  2. [redacted] weeks gestation of pregnancy   3. Leg cramps in pregnancy --Bilateral calf pain, was worse a few days ago, none today.  No edema on exam, Homan's negative.  --Continue stretching, heat, PO fluids, add  magnesium PRN.  --Reviewed warning signs, reasons to seek care.   4. Vaginal odor --Pt reports concerns about odor prior to pregnancy, but was better with boric acid suppositories, probiotics, apple cider vinegar baths.  Discussed safety of these in pregnancy, and pt may resume to improve odor.   --Offered vaginal swab pt pt declined today --Pt washing several times per day, suggested less washing, to keep normal bacterial present.  F/U after pregnancy for more prevention, long term treatment of  BV if needed.   Preterm labor symptoms and general obstetric precautions including but not limited to vaginal bleeding, contractions, leaking of fluid and fetal movement were reviewed in detail with the patient. Please refer to After Visit Summary for other counseling recommendations.   Return in about 4 weeks (around 09/20/2023) for LOB.  Future Appointments  Date Time Provider Department Center  09/06/2023 10:00 AM [facility] NURSE [facility]  09/06/2023 10:15 AM [facility] PROVIDER 1 [facility]  09/06/2023 10:30 AM [facility] US1 [facility]  09/13/2023  9:45 AM [facility]  09/20/2023  9:35 AM Constant, Carles Cheadle, MD [facility] None    Arlester Bence, CNM

## 2023-08-23 NOTE — Progress Notes (Signed)
 ROB, Pt declined AFP screening.  She is concerned about her lack of weight gain.  C/o pain 7/10 in her calf.

## 2023-08-30 NOTE — BH Specialist Note (Unsigned)
 Pt did not arrive to video visit and did not answer the phone; Unable to leave message; left MyChart message for patient.

## 2023-09-01 ENCOUNTER — Ambulatory Visit (INDEPENDENT_AMBULATORY_CARE_PROVIDER_SITE_OTHER)

## 2023-09-01 VITALS — BP 94/58 | HR 86

## 2023-09-01 DIAGNOSIS — Z23 Encounter for immunization: Secondary | ICD-10-CM

## 2023-09-01 DIAGNOSIS — Z3480 Encounter for supervision of other normal pregnancy, unspecified trimester: Secondary | ICD-10-CM

## 2023-09-01 NOTE — Progress Notes (Signed)
 Pt is in the office requesting flu vaccine and Quantiferon TB blood work and states that it is needed for school. Administered flu vaccine in [place] and pt tolerated well. Quantiferon ordered with provider approval.

## 2023-09-02 ENCOUNTER — Other Ambulatory Visit: Payer: Self-pay

## 2023-09-02 ENCOUNTER — Inpatient Hospital Stay (HOSPITAL_COMMUNITY)
Admission: AD | Admit: 2023-09-02 | Discharge: 2023-09-02 | Disposition: A | Discharge status: Home / Self Care | Attending: Obstetrics and Gynecology | Admitting: Obstetrics and Gynecology

## 2023-09-02 ENCOUNTER — Inpatient Hospital Stay (EMERGENCY_DEPARTMENT_HOSPITAL)
Admission: AD | Admit: 2023-09-02 | Discharge: 2023-09-02 | Disposition: A | Discharge status: Home / Self Care | Source: Home / Self Care | Attending: Obstetrics and Gynecology | Admitting: Obstetrics and Gynecology

## 2023-09-02 DIAGNOSIS — R112 Nausea with vomiting, unspecified: Secondary | ICD-10-CM | POA: Diagnosis not present

## 2023-09-02 DIAGNOSIS — O26892 Other specified pregnancy related conditions, second trimester: Secondary | ICD-10-CM | POA: Diagnosis not present

## 2023-09-02 DIAGNOSIS — M7918 Myalgia, other site: Secondary | ICD-10-CM | POA: Insufficient documentation

## 2023-09-02 DIAGNOSIS — T50B95A Adverse effect of other viral vaccines, initial encounter: Secondary | ICD-10-CM | POA: Insufficient documentation

## 2023-09-02 DIAGNOSIS — O219 Vomiting of pregnancy, unspecified: Secondary | ICD-10-CM | POA: Diagnosis not present

## 2023-09-02 DIAGNOSIS — Z3A18 18 weeks gestation of pregnancy: Secondary | ICD-10-CM

## 2023-09-02 DIAGNOSIS — O9A212 Injury, poisoning and certain other consequences of external causes complicating pregnancy, second trimester: Secondary | ICD-10-CM | POA: Insufficient documentation

## 2023-09-02 DIAGNOSIS — R519 Headache, unspecified: Secondary | ICD-10-CM | POA: Diagnosis not present

## 2023-09-02 DIAGNOSIS — Z3480 Encounter for supervision of other normal pregnancy, unspecified trimester: Secondary | ICD-10-CM

## 2023-09-02 DIAGNOSIS — Z3492 Encounter for supervision of normal pregnancy, unspecified, second trimester: Secondary | ICD-10-CM

## 2023-09-02 LAB — URINALYSIS, ROUTINE W REFLEX MICROSCOPIC
Bacteria, UA: NONE SEEN
Bilirubin Urine: NEGATIVE
Glucose, UA: NEGATIVE mg/dL
Hgb urine dipstick: NEGATIVE
Ketones, ur: NEGATIVE mg/dL
Nitrite: NEGATIVE
Protein, ur: NEGATIVE mg/dL
Specific Gravity, Urine: 1.015 (ref 1.005–1.030)
pH: 6 (ref 5.0–8.0)

## 2023-09-02 MED ORDER — ACETAMINOPHEN 500 MG PO TABS
1000.0000 mg | ORAL_TABLET | Freq: Once | ORAL | Status: AC
  Administered 2023-09-02: 1000 mg via ORAL
  Filled 2023-09-02: qty 2

## 2023-09-02 MED ORDER — ACETAMINOPHEN-CAFFEINE 500-65 MG PO TABS
2.0000 | ORAL_TABLET | Freq: Once | ORAL | Status: AC
  Administered 2023-09-02: 2 via ORAL
  Filled 2023-09-02: qty 2

## 2023-09-02 MED ORDER — ONDANSETRON 4 MG PO TBDP
4.0000 mg | ORAL_TABLET | Freq: Once | ORAL | Status: AC
  Administered 2023-09-02: 4 mg via ORAL
  Filled 2023-09-02: qty 1

## 2023-09-02 NOTE — MAU Note (Addendum)
 MAU Triage Note:  .Sonya Stevens is a 23 y.o. at [redacted]w[redacted]d here in MAU reporting:  Had flu shot today and since then has felt weak, has had nausea and a headache. Vomited twice before coming here.  Denies vaginal bleeding or abdominal pain, +Flutters Pain Score: 0-No pain     LMP: Patient's last menstrual period was 04/02/2023 (approximate).  Vitals:   09/02/23 0137  BP: (!) 103/59  Pulse: 95  Resp: 14  Temp: 97.9 F (36.6 C)  SpO2: 100%    FHT: Fetal Heart Rate Mode: Doppler Baseline Rate (A): 153 bpm Lab orders placed from triage: UA
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
## 2023-09-02 NOTE — MAU Provider Note (Signed)
 Chief Complaint:  Emesis and Nausea   Event Date/Time   First Provider Initiated Contact with Patient 09/02/23 0144     HPI: Sonya Stevens is a 23 y.o. G2P1001 at 70w4dwho presents to maternity admissions reporting having had a flu shot today (for school).  Felt "bad" afterward and spent all day in bed.  Then tonight vomited twice.  Did not take anything for nausea. . She denies LOF, vaginal bleeding, dizziness, diarrhea, constipation or fever/chills.    Emesis  This is a new problem. The current episode started today. Episode frequency: twice. There has been no fever. Associated symptoms include headaches. Pertinent negatives include no abdominal pain, chills, diarrhea, dizziness or fever. She has tried nothing for the symptoms.   RN Note: .Sonya Stevens is a 23 y.o. at [redacted]w[redacted]d here in MAU reporting:  Had flu shot today and since then has felt weak, has had nausea and a headache. Vomited twice before coming here.  Denies vaginal bleeding or abdominal pain, +Flutters Pain Score: 0-No pain  Past Medical History: Past Medical History:  Diagnosis Date   Anemia     Past obstetric history: OB History  Gravida Para Term Preterm AB Living  2 1 1   1   SAB IAB Ectopic Multiple Live Births      1    # Outcome Date GA Lbr Len/2nd Weight Sex Type Anes PTL Lv  2 Current           1 Term 05/18/21 [redacted]w[redacted]d   M Vag-Spont   LIV    Past Surgical History: Past Surgical History:  Procedure Laterality Date   ILEOSTOMY  09-04-99   2 wks old, removed part of small intestine    Family History: Family History  Problem Relation Age of Onset   Healthy Mother    Healthy Father     Social History: Social History   Tobacco Use   Smoking status: Never   Smokeless tobacco: Never  Vaping Use   Vaping status: Never Used  Substance Use Topics   Alcohol use: Not Currently   Drug use: Not Currently    Types: Marijuana    Comment: Last smoked March 2022    Allergies: No  Known Allergies  Meds:  Medications Prior to Admission  Medication Sig Dispense Refill Last Dose/Taking   Blood Pressure Monitoring (BLOOD PRESSURE KIT) DEVI 1 Device by Does not apply route once a week. (Patient not taking: Reported on 07/26/2023) 1 each 0    Blood Pressure Monitoring (BLOOD PRESSURE KIT) DEVI 1 Device by Does not apply route once a week. (Patient not taking: Reported on 07/26/2023) 1 each 0    Doxylamine -Pyridoxine  (DICLEGIS ) 10-10 MG TBEC Take 2 tablets by mouth at bedtime. If symptoms persist, add one tablet in the morning and one in the afternoon (Patient not taking: Reported on 07/26/2023) 100 tablet 5    Doxylamine -Pyridoxine  ER (BONJESTA ) 20-20 MG TBCR Take 1 tablet by mouth at bedtime. (Patient not taking: Reported on 07/26/2023) 30 tablet 2    ferrous sulfate  (FERROUSUL) 325 (65 FE) MG tablet Take 1 tablet (325 mg total) by mouth every other day. (Patient not taking: Reported on 07/26/2023) 60 tablet 1    ferrous sulfate  325 (65 FE) MG EC tablet Take 325 mg by mouth 3 (three) times daily with meals. (Patient not taking: Reported on 07/26/2023)      glycopyrrolate  (ROBINUL ) 2 MG tablet Take 1 tablet (2 mg total) by mouth 3 (three) times daily as needed.  30 tablet 3    ondansetron  (ZOFRAN ) 4 MG tablet Take 1 tablet (4 mg total) by mouth every 8 (eight) hours as needed for nausea or vomiting. 30 tablet 2    Prenatal Vit-Fe Fumarate-FA (PRENATAL VITAMIN PO) Take 1 tablet by mouth daily.      promethazine  (PHENERGAN ) 25 MG tablet Take 1 tablet (25 mg total) by mouth every 6 (six) hours as needed for nausea or vomiting. (Patient not taking: Reported on 07/26/2023) 30 tablet 2     I have reviewed patient's Past Medical Hx, Surgical Hx, Family Hx, Social Hx, medications and allergies.   ROS:  Review of Systems  Constitutional:  Negative for chills and fever.  Gastrointestinal:  Positive for vomiting. Negative for abdominal pain and diarrhea.  Neurological:  Positive for headaches.  Negative for dizziness.   Other systems negative  Physical Exam  Patient Vitals for the past 24 hrs:  BP Temp Temp src Pulse Resp SpO2 Height Weight  09/02/23 0137 (!) 103/59 97.9 F (36.6 C) Oral 95 14 100 % 5\' 2"  (1.575 m) 53.1 kg   Constitutional: Well-developed, well-nourished female in no acute distress.  Cardiovascular: normal rate  Respiratory: normal effort MS: Extremities nontender, no edema, normal ROM Neurologic: Alert and oriented x 4.   FHT:  153   Labs: Results for orders placed or performed during the hospital encounter of 09/02/23 (from the past 24 hours)  Urinalysis, Routine w reflex microscopic -Urine, Clean Catch     Status: Abnormal   Collection Time: 09/02/23  1:47 AM  Result Value Ref Range   Color, Urine YELLOW YELLOW   APPearance HAZY (A) CLEAR   Specific Gravity, Urine 1.015 1.005 - 1.030   pH 6.0 5.0 - 8.0   Glucose, UA NEGATIVE NEGATIVE mg/dL   Hgb urine dipstick NEGATIVE NEGATIVE   Bilirubin Urine NEGATIVE NEGATIVE   Ketones, ur NEGATIVE NEGATIVE mg/dL   Protein, ur NEGATIVE NEGATIVE mg/dL   Nitrite NEGATIVE NEGATIVE   Leukocytes,Ua TRACE (A) NEGATIVE   RBC / HPF 0-5 0 - 5 RBC/hpf   WBC, UA 0-5 0 - 5 WBC/hpf   Bacteria, UA NONE SEEN NONE SEEN   Squamous Epithelial / HPF 6-10 0 - 5 /HPF   Mucus PRESENT     B/Positive/-- (03/17 1151)  Imaging:  No results found.  MAU Course/MDM: I have reviewed the triage vital signs and the nursing notes.   Pertinent labs & imaging results that were available during my care of the patient were reviewed by me and considered in my medical decision making (see chart for details).      I have reviewed her medical records including past results, notes and treatments.   I have ordered labs and reviewed results. UA is clear  .  Treatments in MAU included Zofran  for nausea and Excedrin Tension for headache.  Patient left after receiving meds. .    Assessment: Single IUP at.[redacted]w[redacted]d Nausea and vomiting Headache    Plan: Discharge home Has antiemetic meds at home Follow up in Office for prenatal visits and recheck  Pt stable at time of discharge.  Holmes Lusher CNM, MSN Certified Nurse-Midwife 09/02/2023 1:45 AM
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
## 2023-09-05 LAB — QUANTIFERON-TB GOLD PLUS
QuantiFERON Mitogen Value: 6.01 [IU]/mL
QuantiFERON Nil Value: 0.04 [IU]/mL
QuantiFERON TB1 Ag Value: 0.05 [IU]/mL
QuantiFERON TB2 Ag Value: 0.05 [IU]/mL
QuantiFERON-TB Gold Plus: NEGATIVE

## 2023-09-06 ENCOUNTER — Ambulatory Visit (HOSPITAL_BASED_OUTPATIENT_CLINIC_OR_DEPARTMENT_OTHER): Payer: 59 | Admitting: Maternal & Fetal Medicine

## 2023-09-06 ENCOUNTER — Other Ambulatory Visit: Payer: Self-pay | Admitting: *Deleted

## 2023-09-06 ENCOUNTER — Ambulatory Visit: Payer: 59

## 2023-09-06 ENCOUNTER — Ambulatory Visit: Payer: 59 | Attending: Obstetrics and Gynecology

## 2023-09-06 VITALS — BP 104/52 | HR 82

## 2023-09-06 DIAGNOSIS — Z9889 Other specified postprocedural states: Secondary | ICD-10-CM | POA: Diagnosis not present

## 2023-09-06 DIAGNOSIS — D563 Thalassemia minor: Secondary | ICD-10-CM | POA: Insufficient documentation

## 2023-09-06 DIAGNOSIS — O2692 Pregnancy related conditions, unspecified, second trimester: Secondary | ICD-10-CM | POA: Diagnosis not present

## 2023-09-06 DIAGNOSIS — Z148 Genetic carrier of other disease: Secondary | ICD-10-CM

## 2023-09-06 DIAGNOSIS — O283 Abnormal ultrasonic finding on antenatal screening of mother: Secondary | ICD-10-CM | POA: Insufficient documentation

## 2023-09-06 DIAGNOSIS — O43122 Velamentous insertion of umbilical cord, second trimester: Secondary | ICD-10-CM

## 2023-09-06 DIAGNOSIS — Z363 Encounter for antenatal screening for malformations: Secondary | ICD-10-CM | POA: Diagnosis not present

## 2023-09-06 DIAGNOSIS — Z3A19 19 weeks gestation of pregnancy: Secondary | ICD-10-CM | POA: Insufficient documentation

## 2023-09-06 DIAGNOSIS — Z368A Encounter for antenatal screening for other genetic defects: Secondary | ICD-10-CM | POA: Insufficient documentation

## 2023-09-06 DIAGNOSIS — O99012 Anemia complicating pregnancy, second trimester: Secondary | ICD-10-CM | POA: Diagnosis not present

## 2023-09-06 DIAGNOSIS — O358XX0 Maternal care for other (suspected) fetal abnormality and damage, not applicable or unspecified: Secondary | ICD-10-CM | POA: Insufficient documentation

## 2023-09-06 DIAGNOSIS — Z3480 Encounter for supervision of other normal pregnancy, unspecified trimester: Secondary | ICD-10-CM | POA: Diagnosis present

## 2023-09-06 NOTE — Progress Notes (Addendum)
 Patient information  Patient Name: Sonya Stevens  Patient MRN:   [id number]  Referring practice: [facility] Referring Provider: [place] [facility]  MFM CONSULT  [name] is a 23 y.o. G2P1001 at [redacted]w[redacted]d here for ultrasound and consultation. 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 During today's visit we focused on the following concerns:   RE history of abdominal surgery at age 76 months old: The patient is uncertain as to exactly the problem she had a child.  She does not know of any organs were removed but she knows that she had an intestinal problem requiring surgery in the form of an open laparotomy in 2001.  She also required ileostomy.  This happened at [facility] for the patient.  She has requested records but they state that they are not available.  I discussed that this is unlikely to further impact her pregnancy but if she were to have abdominal surgery again there should be mindfulness about scar tissue and distorted anatomy.  RE velamentous cord insertion: We discussed the diagnosis, clinical significance, and management of a velamentous umbilical cord insertion. This condition occurs in approximately 1% of singleton pregnancies, with even higher rates observed in multifetal pregnancies. In twin gestations, the fetus with a velamentous cord insertion is often smaller compared to those with a normal cord insertion. Prenatal ultrasound can typically identify velamentous cord insertion, although differentiation  from a marginal cord insertion may be challenging. If the velamentous cord is located within 5 cm of the internal cervical os, it is classified as vasa previa. Today's ultrasound does not show evidence of vasa previa. While most pregnancies with velamentous cord insertion are uncomplicated, some studies suggest an association with a mild to moderate increase in fetal growth restriction, preterm delivery, congenital anomalies, low Apgar scores, fetal and neonatal death, retained placenta, and postpartum hemorrhage. These risks may stem from the absence of Wharton's jelly surrounding the fetal vessels, increasing their vulnerability to compression or rupture. Intrapartum fetal heart rate patterns may demonstrate variable decelerations, which could necessitate cesarean delivery if not responsive to intrauterine resuscitation.  RE EIF: An intracardiac echogenic focus was noted on today's ultrasound. This is thought to represent a calcification of the ventricular papillary muscles.  There is no adverse cardiac function associated with this finding.  This is seen in about 3% to 4% of normal pregnancies but is higher in a pregnancy affected by Down Syndrome. Historically there was association with aneuploidy but in the presence of normal aneuploidy screening this is considered a normal variant. I reassured the patient that the likelihood of Down syndrome is extremely low.     Sonographic findings Single intrauterine pregnancy at 19w 1d. Fetal cardiac activity:  Observed and appears normal. Presentation: Breech. The anatomic structures that were well seen appear normal without evidence of soft markers.  Some of the anatomy remains suboptimally visualized. There is an echogenic focus which is a normal variant.  Fetal biometry shows the estimated fetal weight at the 68 percentile. Amniotic fluid: Within normal limits.  MVP: 3.83 cm. Placenta: Right  lateral. Adnexa: No masses visualized.  There are limitations of  prenatal ultrasound such as the inability to detect certain abnormalities due to poor visualization. Various factors such as fetal position, gestational age and maternal body habitus may increase the difficulty in visualizing the fetal anatomy.    Recommendations -EDD is Estimated Date of Delivery: 01/30/24 based on ultrasound. -Detailed ultrasound was done today without abnormalities except a velamentous cord insertion. -Follow-up anatomy and fetal growth in 4 to 6 weeks -Serial growth ultrasounds every 4 to 6 weeks during the pregnancy due to the presence of velamentous cord insertion -Antenatal testing to start around 36 weeks due to the presence of velamentous cord insertion -Continue routine prenatal care with referring OB provider  Review of Systems: A review of systems was performed and was negative except per HPI   Vitals and Physical Exam    09/06/2023   10:35 AM 09/02/2023    1:46 PM 09/02/2023    1:39 PM  Vitals with BMI  Height   5\' 2"   Weight   115 lbs 14 oz  BMI   21.19  Systolic 104 93   Diastolic 52 52   Pulse 82 92     Sitting comfortably on the sonogram table Nonlabored breathing Normal rate and rhythm Abdomen is nontender  Past pregnancies OB History  Gravida Para Term Preterm AB Living  2 1 1   1   SAB IAB Ectopic Multiple Live Births      1    # Outcome Date GA Lbr Len/2nd Weight Sex Type Anes PTL Lv  2 Current           1 Term 05/18/21 [redacted]w[redacted]d   M Vag-Spont   LIV     I spent 45 minutes reviewing the patients chart, including labs and images as well as counseling the patient about her medical conditions. Greater than 50% of the time was spent in direct face-to-face patient counseling.  Penney Bowling, DO Maternal fetal medicine, [place]   09/06/2023  12:26 PM

## 2023-09-08 ENCOUNTER — Encounter: Payer: Self-pay | Admitting: Obstetrics and Gynecology

## 2023-09-13 ENCOUNTER — Ambulatory Visit: Payer: Self-pay | Admitting: Clinical

## 2023-09-20 ENCOUNTER — Other Ambulatory Visit: Payer: Self-pay | Admitting: Nurse Practitioner

## 2023-09-20 ENCOUNTER — Encounter: Admitting: Obstetrics and Gynecology

## 2023-09-20 ENCOUNTER — Encounter (HOSPITAL_COMMUNITY): Payer: Self-pay | Admitting: Obstetrics & Gynecology

## 2023-09-20 ENCOUNTER — Inpatient Hospital Stay (HOSPITAL_BASED_OUTPATIENT_CLINIC_OR_DEPARTMENT_OTHER)

## 2023-09-20 ENCOUNTER — Inpatient Hospital Stay (HOSPITAL_COMMUNITY)
Admission: AD | Admit: 2023-09-20 | Discharge: 2023-09-20 | Disposition: A | Discharge status: Home / Self Care | Attending: Obstetrics & Gynecology | Admitting: Obstetrics & Gynecology

## 2023-09-20 DIAGNOSIS — Z3A21 21 weeks gestation of pregnancy: Secondary | ICD-10-CM

## 2023-09-20 DIAGNOSIS — R109 Unspecified abdominal pain: Secondary | ICD-10-CM | POA: Diagnosis not present

## 2023-09-20 DIAGNOSIS — O26892 Other specified pregnancy related conditions, second trimester: Secondary | ICD-10-CM | POA: Diagnosis present

## 2023-09-20 DIAGNOSIS — O4703 False labor before 37 completed weeks of gestation, third trimester: Secondary | ICD-10-CM

## 2023-09-20 DIAGNOSIS — O212 Late vomiting of pregnancy: Secondary | ICD-10-CM | POA: Insufficient documentation

## 2023-09-20 DIAGNOSIS — E876 Hypokalemia: Secondary | ICD-10-CM | POA: Diagnosis not present

## 2023-09-20 DIAGNOSIS — O4702 False labor before 37 completed weeks of gestation, second trimester: Secondary | ICD-10-CM | POA: Diagnosis not present

## 2023-09-20 DIAGNOSIS — Z3686 Encounter for antenatal screening for cervical length: Secondary | ICD-10-CM | POA: Diagnosis not present

## 2023-09-20 DIAGNOSIS — O219 Vomiting of pregnancy, unspecified: Secondary | ICD-10-CM

## 2023-09-20 DIAGNOSIS — O99282 Endocrine, nutritional and metabolic diseases complicating pregnancy, second trimester: Secondary | ICD-10-CM | POA: Diagnosis not present

## 2023-09-20 LAB — CBC WITH DIFFERENTIAL/PLATELET
Abs Immature Granulocytes: 0.08 10*3/uL — ABNORMAL HIGH (ref 0.00–0.07)
Basophils Absolute: 0 10*3/uL (ref 0.0–0.1)
Basophils Relative: 0 %
Eosinophils Absolute: 0 10*3/uL (ref 0.0–0.5)
Eosinophils Relative: 0 %
HCT: 33.6 % — ABNORMAL LOW (ref 36.0–46.0)
Hemoglobin: 11 g/dL — ABNORMAL LOW (ref 12.0–15.0)
Immature Granulocytes: 1 %
Lymphocytes Relative: 14 %
Lymphs Abs: 1.5 10*3/uL (ref 0.7–4.0)
MCH: 29 pg (ref 26.0–34.0)
MCHC: 32.7 g/dL (ref 30.0–36.0)
MCV: 88.7 fL (ref 80.0–100.0)
Monocytes Absolute: 0.4 10*3/uL (ref 0.1–1.0)
Monocytes Relative: 4 %
Neutro Abs: 8.3 10*3/uL — ABNORMAL HIGH (ref 1.7–7.7)
Neutrophils Relative %: 81 %
Platelets: 292 10*3/uL (ref 150–400)
RBC: 3.79 MIL/uL — ABNORMAL LOW (ref 3.87–5.11)
RDW: 13.8 % (ref 11.5–15.5)
WBC: 10.3 10*3/uL (ref 4.0–10.5)
nRBC: 0 % (ref 0.0–0.2)

## 2023-09-20 LAB — COMPREHENSIVE METABOLIC PANEL WITH GFR
ALT: 19 U/L (ref 0–44)
AST: 22 U/L (ref 15–41)
Albumin: 3.2 g/dL — ABNORMAL LOW (ref 3.5–5.0)
Alkaline Phosphatase: 96 U/L (ref 38–126)
Anion gap: 14 (ref 5–15)
BUN: 7 mg/dL (ref 6–20)
CO2: 20 mmol/L — ABNORMAL LOW (ref 22–32)
Calcium: 9.6 mg/dL (ref 8.9–10.3)
Chloride: 101 mmol/L (ref 98–111)
Creatinine, Ser: 0.69 mg/dL (ref 0.44–1.00)
GFR, Estimated: >60 mL/min (ref >60)
Glucose, Bld: 99 mg/dL (ref 70–99)
Potassium: 3.2 mmol/L — ABNORMAL LOW (ref 3.5–5.1)
Sodium: 135 mmol/L (ref 135–145)
Total Bilirubin: 0.4 mg/dL (ref 0.0–1.2)
Total Protein: 7.8 g/dL (ref 6.5–8.1)

## 2023-09-20 LAB — WET PREP, GENITAL
Clue Cells Wet Prep HPF POC: NONE SEEN
Sperm: NONE SEEN
Trich, Wet Prep: NONE SEEN
WBC, Wet Prep HPF POC: >=10 — AB (ref <10)
Yeast Wet Prep HPF POC: NONE SEEN

## 2023-09-20 LAB — GC/CHLAMYDIA PROBE AMP (~~LOC~~) NOT AT ARMC
Chlamydia: NEGATIVE
Comment: NEGATIVE
Comment: NORMAL
Neisseria Gonorrhea: NEGATIVE

## 2023-09-20 LAB — TYPE AND SCREEN
ABO/RH(D): B POS
Antibody Screen: NEGATIVE

## 2023-09-20 MED ORDER — INDOMETHACIN 25 MG PO CAPS
100.0000 mg | ORAL_CAPSULE | Freq: Once | ORAL | Status: AC
  Administered 2023-09-20: 100 mg via ORAL
  Filled 2023-09-20: qty 4

## 2023-09-20 MED ORDER — MORPHINE SULFATE (PF) 4 MG/ML IV SOLN
4.0000 mg | Freq: Once | INTRAVENOUS | Status: AC
  Administered 2023-09-20: 4 mg via INTRAVENOUS
  Filled 2023-09-20: qty 1

## 2023-09-20 MED ORDER — INDOMETHACIN 25 MG PO CAPS: 25.0000 mg | ORAL_CAPSULE | Freq: Four times a day (QID) | ORAL | 0 refills | Status: AC

## 2023-09-20 MED ORDER — FAMOTIDINE 20 MG PO TABS: 20.0000 mg | ORAL_TABLET | Freq: Two times a day (BID) | ORAL | 0 refills | Status: DC

## 2023-09-20 MED ORDER — KETOROLAC TROMETHAMINE 30 MG/ML IJ SOLN: 30.0000 mg | Freq: Once | INTRAMUSCULAR | Status: AC

## 2023-09-20 MED ORDER — TERBUTALINE SULFATE 1 MG/ML IJ SOLN
0.2500 mg | Freq: Once | INTRAMUSCULAR | Status: AC
  Administered 2023-09-20: 0.25 mg via SUBCUTANEOUS
  Filled 2023-09-20: qty 1

## 2023-09-20 MED ORDER — CYCLOBENZAPRINE HCL 10 MG PO TABS: 10.0000 mg | ORAL_TABLET | Freq: Two times a day (BID) | ORAL | 0 refills | Status: DC | PRN

## 2023-09-20 MED ORDER — POTASSIUM CHLORIDE CRYS ER 20 MEQ PO TBCR: 20.0000 meq | EXTENDED_RELEASE_TABLET | Freq: Two times a day (BID) | ORAL | 0 refills | Status: DC

## 2023-09-20 MED ORDER — KETOROLAC TROMETHAMINE 30 MG/ML IJ SOLN
INTRAMUSCULAR | Status: AC
  Administered 2023-09-20: 30 mg via INTRAVENOUS
  Filled 2023-09-20: qty 1

## 2023-09-20 MED ORDER — LACTATED RINGERS IV BOLUS
1000.0000 mL | Freq: Once | INTRAVENOUS | Status: AC
  Administered 2023-09-20: 1000 mL via INTRAVENOUS

## 2023-09-20 MED ORDER — METOCLOPRAMIDE HCL 10 MG PO TABS: 10.0000 mg | ORAL_TABLET | Freq: Four times a day (QID) | ORAL | 0 refills | Status: DC

## 2023-09-20 MED ORDER — METOCLOPRAMIDE HCL 5 MG/ML IJ SOLN
10.0000 mg | Freq: Once | INTRAMUSCULAR | Status: AC
  Administered 2023-09-20: 10 mg via INTRAVENOUS
  Filled 2023-09-20: qty 2

## 2023-09-20 MED ORDER — FAMOTIDINE IN NACL 20-0.9 MG/50ML-% IV SOLN
20.0000 mg | Freq: Once | INTRAVENOUS | Status: AC
  Administered 2023-09-20: 20 mg via INTRAVENOUS
  Filled 2023-09-20: qty 50

## 2023-09-20 MED ORDER — PROGESTERONE 200 MG PO CAPS: 200.0000 mg | ORAL_CAPSULE | Freq: Every day | ORAL | 0 refills | Status: DC

## 2023-09-20 MED ORDER — ONDANSETRON HCL 4 MG/2ML IJ SOLN
4.0000 mg | Freq: Once | INTRAMUSCULAR | Status: AC
  Administered 2023-09-20: 4 mg via INTRAVENOUS
  Filled 2023-09-20: qty 2

## 2023-09-20 NOTE — MAU Provider Note (Signed)
 Chief Complaint:  Abdominal Pain   HPI   Sonya Stevens is a 23 y.o. G2P1001 at [redacted]w[redacted]d who presents to maternity admissions reporting severe abdominal pain and tightening that started today at 6 PM. She is also reported Vomiting ~ 7 times with associated nausea. Denies any VB, LOF, and reports fetal movements are present. Denies any urinary c/o , vaginal discharge/ burning/ itching or irration. She does reports recent IC yesterday.   Has not taken anything for the pain other than Tylenol  which she was unable to tolerate and vomited immediately afterwards.  Pregnancy Course: Femina  Past Medical History:  Diagnosis Date   Anemia    OB History  Gravida Para Term Preterm AB Living  2 1 1   1   SAB IAB Ectopic Multiple Live Births      1    # Outcome Date GA Lbr Len/2nd Weight Sex Type Anes PTL Lv  2 Current           1 Term 05/18/21 [redacted]w[redacted]d   M Vag-Spont   LIV   Past Surgical History:  Procedure Laterality Date   ILEOSTOMY  Oct 02, 1999   2 wks old, removed part of small intestine   Family History  Problem Relation Age of Onset   Healthy Mother    Healthy Father    Social History   Tobacco Use   Smoking status: Never   Smokeless tobacco: Never  Vaping Use   Vaping status: Never Used  Substance Use Topics   Alcohol use: Not Currently   Drug use: Not Currently    Types: Marijuana    Comment: Last smoked March 2022   No Known Allergies Medications Prior to Admission  Medication Sig Dispense Refill Last Dose/Taking   acetaminophen  (TYLENOL ) 500 MG tablet Take 500 mg by mouth every 6 (six) hours as needed.   09/19/2023 at 11:00 PM   Prenatal Vit-Fe Fumarate-FA (PRENATAL VITAMIN PO) Take 1 tablet by mouth daily.   09/19/2023   Blood Pressure Monitoring (BLOOD PRESSURE KIT) DEVI 1 Device by Does not apply route once a week. (Patient not taking: Reported on 07/26/2023) 1 each 0    Blood Pressure Monitoring (BLOOD PRESSURE KIT) DEVI 1 Device by Does not apply route once a  week. (Patient not taking: Reported on 07/26/2023) 1 each 0    Doxylamine -Pyridoxine  (DICLEGIS ) 10-10 MG TBEC Take 2 tablets by mouth at bedtime. If symptoms persist, add one tablet in the morning and one in the afternoon (Patient not taking: Reported on 07/26/2023) 100 tablet 5    Doxylamine -Pyridoxine  ER (BONJESTA ) 20-20 MG TBCR Take 1 tablet by mouth at bedtime. (Patient not taking: Reported on 07/26/2023) 30 tablet 2    ferrous sulfate  (FERROUSUL) 325 (65 FE) MG tablet Take 1 tablet (325 mg total) by mouth every other day. (Patient not taking: Reported on 07/26/2023) 60 tablet 1    ferrous sulfate  325 (65 FE) MG EC tablet Take 325 mg by mouth 3 (three) times daily with meals. (Patient not taking: Reported on 07/26/2023)      glycopyrrolate  (ROBINUL ) 2 MG tablet Take 1 tablet (2 mg total) by mouth 3 (three) times daily as needed. (Patient not taking: Reported on 09/06/2023) 30 tablet 3    ondansetron  (ZOFRAN ) 4 MG tablet Take 1 tablet (4 mg total) by mouth every 8 (eight) hours as needed for nausea or vomiting. 30 tablet 2    promethazine  (PHENERGAN ) 25 MG tablet Take 1 tablet (25 mg total) by mouth every 6 (six)  hours as needed for nausea or vomiting. (Patient not taking: Reported on 07/26/2023) 30 tablet 2     I have reviewed patient's Past Medical Hx, Surgical Hx, Family Hx, Social Hx, medications and allergies.   ROS  Pertinent items noted in HPI and remainder of comprehensive ROS otherwise negative.   PHYSICAL EXAM   Patient Vitals for the past 24 hrs:  BP Temp Temp src Pulse Resp SpO2 Height Weight  09/20/23 0413 (!) 90/43 -- -- 91 -- -- -- --  09/20/23 0405 -- -- -- -- -- 100 % -- --  09/20/23 0401 (!) 87/41 -- -- 100 17 -- -- --  09/20/23 0400 -- -- -- -- -- 100 % -- --  09/20/23 0204 -- -- -- -- -- 100 % -- --  09/20/23 0200 -- -- -- -- -- 99 % -- --  09/20/23 0157 -- -- -- -- -- 100 % -- --  09/20/23 0152 -- -- -- -- -- 100 % -- --  09/20/23 0144 -- -- -- -- -- 98 % -- --  09/20/23  0140 -- -- -- -- -- 100 % -- --  09/20/23 0135 -- -- -- -- -- 99 % -- --  09/20/23 0130 -- -- -- -- -- 96 % -- --  09/20/23 0041 102/64 -- -- 66 16 -- -- --  09/20/23 0018 102/61 (!) 97.4 F (36.3 C) Oral 64 20 99 % 5\' 2"  (1.575 m) 52.9 kg    Constitutional: Well-developed, well-nourished female in no acute distress.  Cardiovascular: normal rate & rhythm, warm and well-perfused Respiratory: normal effort, no problems with respiration noted GI: Abd soft, non-tender, gravid MS: Extremities nontender, no edema, normal ROM Neurologic: Alert and oriented x 4.  GU: no CVA tenderness Pelvic: NEFG, moderate amount of mucoid discharge present, no pooling, cervix visually closed, swabs obtained  Dilation: 2 Effacement (%): 60 Station: -2 Presentation: Vertex Exam by:: Debbe Fail NP  Fetal Tracing: [place]one's 154 TOCO: CTX q 2-3 minutes and palpable    Labs: Results for orders placed or performed during the hospital encounter of 09/20/23 (from the past 24 hours)  Wet prep, genital     Status: Abnormal   Collection Time: 09/20/23  1:01 AM   Specimen: PATH Cytology Cervicovaginal Ancillary Only  Result Value Ref Range   Yeast Wet Prep HPF POC NONE SEEN NONE SEEN   Trich, Wet Prep NONE SEEN NONE SEEN   Clue Cells Wet Prep HPF POC NONE SEEN NONE SEEN   WBC, Wet Prep HPF POC >=10 (A) <10   Sperm NONE SEEN   CBC with Differential/Platelet     Status: Abnormal   Collection Time: 09/20/23  1:14 AM  Result Value Ref Range   WBC 10.3 4.0 - 10.5 K/uL   RBC 3.79 (L) 3.87 - 5.11 MIL/uL   Hemoglobin 11.0 (L) 12.0 - 15.0 g/dL   HCT 40.9 (L) 81.1 - 91.4 %   MCV 88.7 80.0 - 100.0 fL   MCH 29.0 26.0 - 34.0 pg   MCHC 32.7 30.0 - 36.0 g/dL   RDW 78.2 95.6 - 21.3 %   Platelets 292 150 - 400 K/uL   nRBC 0.0 0.0 - 0.2 %   Neutrophils Relative % 81 %   Neutro Abs 8.3 (H) 1.7 - 7.7 K/uL   Lymphocytes Relative 14 %   Lymphs Abs 1.5 0.7 - 4.0 K/uL   Monocytes Relative 4 %   Monocytes Absolute 0.4  0.1 - 1.0 K/uL  Eosinophils Relative 0 %   Eosinophils Absolute 0.0 0.0 - 0.5 K/uL   Basophils Relative 0 %   Basophils Absolute 0.0 0.0 - 0.1 K/uL   Immature Granulocytes 1 %   Abs Immature Granulocytes 0.08 (H) 0.00 - 0.07 K/uL  Comprehensive metabolic panel     Status: Abnormal   Collection Time: 09/20/23  1:14 AM  Result Value Ref Range   Sodium 135 135 - 145 mmol/L   Potassium 3.2 (L) 3.5 - 5.1 mmol/L   Chloride 101 98 - 111 mmol/L   CO2 20 (L) 22 - 32 mmol/L   Glucose, Bld 99 70 - 99 mg/dL   BUN 7 6 - 20 mg/dL   Creatinine, Ser 9.51 0.44 - 1.00 mg/dL   Calcium 9.6 8.9 - 88.4 mg/dL   Total Protein 7.8 6.5 - 8.1 g/dL   Albumin 3.2 (L) 3.5 - 5.0 g/dL   AST 22 15 - 41 U/L   ALT 19 0 - 44 U/L   Alkaline Phosphatase 96 38 - 126 U/L   Total Bilirubin 0.4 0.0 - 1.2 mg/dL   GFR, Estimated >16 >60 mL/min   Anion gap 14 5 - 15  Type and screen [facility]     Status: None   Collection Time: 09/20/23  1:14 AM  Result Value Ref Range   ABO/RH(D) B POS    Antibody Screen NEG    Sample Expiration      09/23/2023,2359 Performed at [facility], [address], [place], [place] [address]     Imaging:  No results found.  MDM & MAU COURSE  MDM:  HIGH  R/O PTL at [redacted]w[redacted]d Vomiting   - ctx q 2 minutes on TOCO - IVF Bolus - Swabs obtained to r/o infection - UA ( Patient unable to leave sample)  - CBC, CMP r/o dehydration/ electrolyte abnormalities:   (Hypokalemia - Will plan for PO Supplementation) - Antiemetics ordered  - Plan for tocolysis  ( DW Dr Thurmon [place] - OB Attending) Will start with terbutaline 0.25 SQ  x 1 and start Indocin regimen  - OB Transvaginal ultrasound/ limited ordered for cervical length and r/o abruption  - @ [phone] RN notified me  patient unable to tolerated Indocin PO , (vomited) therefore after discussion with Dr Thurmon [place] ( OB Attending)  Toradol 30 mg IV administered and will plan to continue q 6 hrs until stabilized   @ 0225 Verbal  report from Sonographer with cervical length = 3.5 cm and no visualized funneling with no evidence of placental abruption at this time   - Pain management and continued observation    @ 0400: Patient reported feeling better and ctx have resolved x 1 hour  No longer vomiting and tolerating PO Ice chips   Plan for discharge home on Indocin 25 mg q 6 hrs x 2 days with Progesterone vaginal at bedtime as previously d/w Dr Thurmon [place] (Ob Attending)  MAU Course: Orders Placed This Encounter  Procedures   Wet prep, genital   Culture, OB Urine   US  MFM OB LIMITED   CBC with Differential/Platelet   Comprehensive metabolic panel   Urinalysis, Routine w reflex microscopic -Urine, Random   Type and screen [facility]   Discharge patient Discharge disposition: 01-Home or Self Care; Discharge patient date: 09/20/2023   Discharge patient Discharge disposition: 01-Home or Self Care; Discharge patient date: 09/20/2023   Meds ordered this encounter  Medications   lactated ringers  bolus 1,000 mL  terbutaline (BRETHINE) injection 0.25 mg   indomethacin (INDOCIN) capsule 100 mg   ondansetron  (ZOFRAN ) injection 4 mg   famotidine  (PEPCID ) IVPB 20 mg premix   ketorolac (TORADOL) 30 MG/ML injection 30 mg   ketorolac (TORADOL) 30 MG/ML injection    Gaylyn Keas, Dea F: cabinet override   morphine (PF) 4 MG/ML injection 4 mg    Refill:  0   metoCLOPramide (REGLAN) injection 10 mg   metoCLOPramide (REGLAN) 10 MG tablet    Sig: Take 1 tablet (10 mg total) by mouth every 6 (six) hours.    Dispense:  30 tablet    Refill:  0    Supervising Provider:   PRATT, TANYA S [[id number]]   cyclobenzaprine  (FLEXERIL ) 10 MG tablet    Sig: Take 1 tablet (10 mg total) by mouth 2 (two) times daily as needed for muscle spasms.    Dispense:  20 tablet    Refill:  0    Supervising Provider:   PRATT, TANYA S [[id number]]   progesterone (PROMETRIUM) 200 MG capsule    Sig: Place 1 capsule (200 mg total) vaginally at bedtime.     Dispense:  30 capsule    Refill:  0    Supervising Provider:   PRATT, TANYA S [[id number]]   famotidine  (PEPCID ) 20 MG tablet    Sig: Take 1 tablet (20 mg total) by mouth 2 (two) times daily.    Dispense:  60 tablet    Refill:  0    Supervising Provider:   PRATT, TANYA S [[id number]]   indomethacin (INDOCIN) 25 MG capsule    Sig: Take 1 capsule (25 mg total) by mouth every 6 (six) hours for 2 days.    Dispense:  8 capsule    Refill:  0    Supervising Provider:   PRATT, TANYA S [[id number]]   potassium chloride SA (KLOR-CON M) 20 MEQ tablet    Sig: Take 1 tablet (20 mEq total) by mouth 2 (two) times daily for 2 days.    Dispense:  4 tablet    Refill:  0    Supervising Provider:   PRATT, TANYA S [[id number]]    I have reviewed the patient chart and performed the physical exam . I have ordered & interpreted the lab results and reviewed and interpreted the results and reviewed the with the [facility] Attending Dr Thurmon [place]  Medications ordered as stated below.  A/P as described below.  Counseling and education provided and patient agreeable  with plan as described below. Verbalized understanding.    ASSESSMENT   1. Preterm uterine contractions in second trimester, antepartum   2. [redacted] weeks gestation of pregnancy   3. Nausea and vomiting during pregnancy prior to [redacted] weeks gestation   4. Abdominal pain during pregnancy in second trimester   5. Hypokalemia due to loss of potassium     PLAN  Discharge home in stable condition with strict return precautions.   See AVS for full description of information given to the patient including both verbal and written. Patient verbalized understanding and agrees with the plan as described above.     Follow-up Information     [facility] Follow up.   Why: If symptoms worsen or fail to resolve, As scheduled for ongoing prenatal care Contact information: [address] [place]  [id number] [phone]                Allergies  as of 09/20/2023   No Known Allergies  Medication List     TAKE these medications    acetaminophen  500 MG tablet Commonly known as: TYLENOL  Take 500 mg by mouth every 6 (six) hours as needed.   Blood Pressure Kit Devi 1 Device by Does not apply route once a week.   Blood Pressure Kit Devi 1 Device by Does not apply route once a week.   cyclobenzaprine  10 MG tablet Commonly known as: FLEXERIL  Take 1 tablet (10 mg total) by mouth 2 (two) times daily as needed for muscle spasms.   Doxylamine -Pyridoxine  10-10 MG Tbec Commonly known as: Diclegis  Take 2 tablets by mouth at bedtime. If symptoms persist, add one tablet in the morning and one in the afternoon   Bonjesta  20-20 MG Tbcr Generic drug: Doxylamine -Pyridoxine  ER Take 1 tablet by mouth at bedtime.   famotidine  20 MG tablet Commonly known as: Pepcid  Take 1 tablet (20 mg total) by mouth 2 (two) times daily.   ferrous sulfate  325 (65 FE) MG EC tablet Take 325 mg by mouth 3 (three) times daily with meals.   ferrous sulfate  325 (65 FE) MG tablet Commonly known as: FerrouSul Take 1 tablet (325 mg total) by mouth every other day.   glycopyrrolate  2 MG tablet Commonly known as: ROBINUL  Take 1 tablet (2 mg total) by mouth 3 (three) times daily as needed.   indomethacin 25 MG capsule Commonly known as: INDOCIN Take 1 capsule (25 mg total) by mouth every 6 (six) hours for 2 days.   metoCLOPramide 10 MG tablet Commonly known as: REGLAN Take 1 tablet (10 mg total) by mouth every 6 (six) hours.   ondansetron  4 MG tablet Commonly known as: Zofran  Take 1 tablet (4 mg total) by mouth every 8 (eight) hours as needed for nausea or vomiting.   potassium chloride SA 20 MEQ tablet Commonly known as: KLOR-CON M Take 1 tablet (20 mEq total) by mouth 2 (two) times daily for 2 days.   PRENATAL VITAMIN PO Take 1 tablet by mouth daily.   progesterone 200 MG capsule Commonly known as: Prometrium Place 1 capsule (200 mg  total) vaginally at bedtime.   promethazine  25 MG tablet Commonly known as: PHENERGAN  Take 1 tablet (25 mg total) by mouth every 6 (six) hours as needed for nausea or vomiting.        Debbe Fail, MSN, [facility], [facility] for [employer]

## 2023-09-20 NOTE — MAU Note (Signed)
 Sonya Stevens is a 23 y.o. at [redacted]w[redacted]d here in MAU reporting: Abdominal pain that started earlier today around 1600. Pt states its upper abdomen pain that is constant. Pt states her stomach keeps tightening and she keeps throwing up due to the pain and feels weak. Pt denies LOF or VB. +FM   Onset of complaint: 1600 Pain score: 10/10  Vitals:   09/20/23 0018  BP: 102/61  Pulse: 64  Resp: 20  Temp: (!) 97.4 F (36.3 C)  SpO2: 99%     FHT:154 Lab orders placed from triage:

## 2023-09-20 NOTE — MAU Note (Signed)
 Pt states that her uterus remains relaxed and she feels "so much better".  Pt understands to return to MAU or care provider if pain returns before it becomes regular and severe as it was. Preterm labor precaution, medications and test results reviewed with patient and SO. Both voiced understanding.

## 2023-10-05 ENCOUNTER — Encounter: Admitting: Obstetrics and Gynecology

## 2023-10-11 ENCOUNTER — Ambulatory Visit

## 2023-10-11 DIAGNOSIS — Z3480 Encounter for supervision of other normal pregnancy, unspecified trimester: Secondary | ICD-10-CM

## 2023-10-22 ENCOUNTER — Ambulatory Visit

## 2023-10-22 DIAGNOSIS — Z0130 Encounter for examination of blood pressure without abnormal findings: Secondary | ICD-10-CM

## 2023-10-22 NOTE — Progress Notes (Signed)
 Subjective:  Sonya Stevens is a 23 y.o. female here for BP check. Pt reports feeling tired and restless lately. She is only taking her Prenatals, and discontinued use of all other medications. Does not report headaches at today's visit.  Hypertension ROS: no TIA's, no chest pain on exertion, no dyspnea on exertion, and no swelling of ankles.    Objective:  BP 97/61   Pulse 84   LMP 04/02/2023 (Approximate)   Appearance alert, well appearing, and in no distress. General exam BP noted to be well controlled today in office.    Assessment:   Blood Pressure well controlled.   Plan:  Orders and follow up as documented in patient record.Scheduled for OB visit on 10/25/23. Pt has not obtained BP cuff. RN advised that BP cuff was sent to [facility] for her to pick up.

## 2023-10-25 ENCOUNTER — Ambulatory Visit (INDEPENDENT_AMBULATORY_CARE_PROVIDER_SITE_OTHER): Admitting: Obstetrics and Gynecology

## 2023-10-25 ENCOUNTER — Other Ambulatory Visit (HOSPITAL_COMMUNITY)
Admission: RE | Admit: 2023-10-25 | Discharge: 2023-10-25 | Disposition: A | Discharge status: Home / Self Care | Source: Ambulatory Visit | Attending: Obstetrics and Gynecology | Admitting: Obstetrics and Gynecology

## 2023-10-25 VITALS — BP 95/60 | HR 103 | Wt 124.0 lb

## 2023-10-25 DIAGNOSIS — Z3480 Encounter for supervision of other normal pregnancy, unspecified trimester: Secondary | ICD-10-CM | POA: Insufficient documentation

## 2023-10-25 DIAGNOSIS — Z3A26 26 weeks gestation of pregnancy: Secondary | ICD-10-CM | POA: Diagnosis present

## 2023-10-25 DIAGNOSIS — O43122 Velamentous insertion of umbilical cord, second trimester: Secondary | ICD-10-CM

## 2023-10-25 DIAGNOSIS — Z148 Genetic carrier of other disease: Secondary | ICD-10-CM

## 2023-10-25 DIAGNOSIS — D563 Thalassemia minor: Secondary | ICD-10-CM

## 2023-10-25 DIAGNOSIS — N898 Other specified noninflammatory disorders of vagina: Secondary | ICD-10-CM | POA: Diagnosis not present

## 2023-10-25 DIAGNOSIS — Z9889 Other specified postprocedural states: Secondary | ICD-10-CM

## 2023-10-25 DIAGNOSIS — O283 Abnormal ultrasonic finding on antenatal screening of mother: Secondary | ICD-10-CM

## 2023-10-25 NOTE — Progress Notes (Signed)
   PRENATAL VISIT NOTE  Subjective:  Sonya Stevens is a 23 y.o. G2P1001 at [redacted]w[redacted]d being seen today for ongoing prenatal care.  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  Patient reports odorous vaginal discharge. Having occ mild contractions. Has used OTC boric acid to try to help without relief.  Contractions: Irritability. Vag. Bleeding: None.  Movement: Present. Denies leaking of fluid.   The following portions of the patient's history were reviewed and updated as appropriate: allergies, current medications, past family history, past medical history, past social history, past surgical history and problem list.   Objective:   Vitals:   10/25/23 1108  BP: 95/60  Pulse: (!) 103  Weight: 124 lb (56.2 kg)    Fetal Status:     Movement: Present     General:  Alert, oriented and cooperative. Patient is in no acute distress.  Skin: Skin is warm and dry. No rash noted.   Cardiovascular: Normal heart rate noted  Respiratory: Normal respiratory effort, no problems with respiration noted  Abdomen: Soft, gravid, appropriate for gestational age.  Pain/Pressure: Present      Assessment and Plan:  Pregnancy: G2P1001 at [redacted]w[redacted]d 1. Supervision of other normal pregnancy, antepartum (Primary) 2. [redacted] weeks gestation of pregnancy Discussed si/sx PTL Discussed 2h next visit and need for fasting as well as CBC/HIV/RPR  & tdap next visit  3. Vaginal discharge Advised pt to stop using boric acid - Cervicovaginal ancillary only( [place])  4. Alpha thalassemia silent carrier 5. Increase risk of being a carrier of SMA Partner declines testing  6.  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 Fetal echogenic intracardiac focus on prenatal ultrasound LR NIPS Serial growth US , next scheduled 6/30  8. Hx of abdominal surgery  Please refer to After Visit Summary for other counseling recommendations.   Return in about 3 weeks (around 11/15/2023) for ROB at 29 weeks with 2h GTT, CBC, hIV, RPR, tdap.  Future Appointments  Date Time Provider Department Center  11/08/2023 10:00 AM [facility] PROVIDER 1 [facility]  11/08/2023 10:30 AM [facility] US2 [facility]  11/15/2023  8:45 AM [facility] None  11/15/2023 11:15 AM Izell Marsh, MD [facility] None   Izell Marsh, MD

## 2023-10-25 NOTE — Progress Notes (Addendum)
 ROB, c/o yellow, malodorous vaginal discharge x 3 months, contractions.

## 2023-10-25 NOTE — Patient Instructions (Signed)
 It was nice meeting you today! You will see your results in the MyChart app within 1 week. Please do not eat or drink anything besides water  before your next appointment.

## 2023-10-26 ENCOUNTER — Ambulatory Visit: Payer: Self-pay | Admitting: Obstetrics and Gynecology

## 2023-10-26 LAB — CERVICOVAGINAL ANCILLARY ONLY
Bacterial Vaginitis (gardnerella): POSITIVE — AB
Candida Glabrata: NEGATIVE
Candida Vaginitis: NEGATIVE
Chlamydia: NEGATIVE
Comment: NEGATIVE
Comment: NEGATIVE
Comment: NEGATIVE
Comment: NEGATIVE
Comment: NEGATIVE
Comment: NORMAL
Neisseria Gonorrhea: NEGATIVE
Trichomonas: NEGATIVE

## 2023-10-26 MED ORDER — METRONIDAZOLE 500 MG PO TABS: 500.0000 mg | ORAL_TABLET | Freq: Two times a day (BID) | ORAL | 0 refills | Status: AC

## 2023-11-08 ENCOUNTER — Ambulatory Visit (HOSPITAL_BASED_OUTPATIENT_CLINIC_OR_DEPARTMENT_OTHER)

## 2023-11-08 ENCOUNTER — Other Ambulatory Visit: Payer: Self-pay | Admitting: *Deleted

## 2023-11-08 ENCOUNTER — Ambulatory Visit: Attending: Obstetrics and Gynecology | Admitting: Obstetrics and Gynecology

## 2023-11-08 VITALS — BP 94/53 | HR 92

## 2023-11-08 DIAGNOSIS — O403XX0 Polyhydramnios, third trimester, not applicable or unspecified: Secondary | ICD-10-CM | POA: Diagnosis not present

## 2023-11-08 DIAGNOSIS — Z362 Encounter for other antenatal screening follow-up: Secondary | ICD-10-CM | POA: Diagnosis present

## 2023-11-08 DIAGNOSIS — Z148 Genetic carrier of other disease: Secondary | ICD-10-CM

## 2023-11-08 DIAGNOSIS — O99013 Anemia complicating pregnancy, third trimester: Secondary | ICD-10-CM

## 2023-11-08 DIAGNOSIS — O358XX0 Maternal care for other (suspected) fetal abnormality and damage, not applicable or unspecified: Secondary | ICD-10-CM | POA: Diagnosis not present

## 2023-11-08 DIAGNOSIS — O43122 Velamentous insertion of umbilical cord, second trimester: Secondary | ICD-10-CM | POA: Diagnosis not present

## 2023-11-08 DIAGNOSIS — O43123 Velamentous insertion of umbilical cord, third trimester: Secondary | ICD-10-CM | POA: Diagnosis not present

## 2023-11-08 DIAGNOSIS — O2693 Pregnancy related conditions, unspecified, third trimester: Secondary | ICD-10-CM | POA: Diagnosis not present

## 2023-11-08 DIAGNOSIS — O283 Abnormal ultrasonic finding on antenatal screening of mother: Secondary | ICD-10-CM

## 2023-11-08 DIAGNOSIS — Z3A28 28 weeks gestation of pregnancy: Secondary | ICD-10-CM | POA: Insufficient documentation

## 2023-11-08 DIAGNOSIS — Z3480 Encounter for supervision of other normal pregnancy, unspecified trimester: Secondary | ICD-10-CM

## 2023-11-08 DIAGNOSIS — O409XX0 Polyhydramnios, unspecified trimester, not applicable or unspecified: Secondary | ICD-10-CM

## 2023-11-08 NOTE — Progress Notes (Signed)
 Maternal-Fetal Medicine Consultation Name: Samira Acero MRN: [id number]  G2 P1001 at 28w 2d gestation.  Patient is here for fetal growth assessment.  Velamentous cord insertion was seen at previous ultrasound. Obstetric history is significant for a term vaginal delivery.  Ultrasound Mild polyhydramnios is seen (AFI 28 cm).  Fetal growth is appropriate for gestational age.  The estimated fetal weight is at the 14th percentile.  Velamentous cord insertion is seen again.  I counseled the patient on the finding of polyhydramnios.  I encouraged her to screen for gestational diabetes, which is a common cause for polyhydramnios.  In the absence of gestational diabetes, it is usually idiopathic (no known cause) and is associated with the good fetal outcomes. I explained velamentous cord insertion.  The estimated fetal weight decreased from 68th percentile to 14th percentile.  I informed her that she requires serial fetal growth assessments.   Recommendations - An appointment was made for her to return in 4 weeks for fetal growth assessment. - GDM screening. - If patient continues to have mild polyhydramnios, I recommend weekly antenatal testing from [redacted] weeks gestation until delivery.  Consultation including face-to-face (more than 50%) counseling 20 minutes.

## 2023-11-15 ENCOUNTER — Encounter

## 2023-11-15 ENCOUNTER — Ambulatory Visit (INDEPENDENT_AMBULATORY_CARE_PROVIDER_SITE_OTHER): Payer: Self-pay | Admitting: Obstetrics and Gynecology

## 2023-11-15 VITALS — BP 97/61 | HR 91 | Wt 132.0 lb

## 2023-11-15 DIAGNOSIS — O43123 Velamentous insertion of umbilical cord, third trimester: Secondary | ICD-10-CM | POA: Diagnosis not present

## 2023-11-15 DIAGNOSIS — O403XX0 Polyhydramnios, third trimester, not applicable or unspecified: Secondary | ICD-10-CM | POA: Diagnosis not present

## 2023-11-15 DIAGNOSIS — Z3A29 29 weeks gestation of pregnancy: Secondary | ICD-10-CM

## 2023-11-15 DIAGNOSIS — D563 Thalassemia minor: Secondary | ICD-10-CM

## 2023-11-15 DIAGNOSIS — O283 Abnormal ultrasonic finding on antenatal screening of mother: Secondary | ICD-10-CM

## 2023-11-15 DIAGNOSIS — Z1331 Encounter for screening for depression: Secondary | ICD-10-CM

## 2023-11-15 DIAGNOSIS — O43122 Velamentous insertion of umbilical cord, second trimester: Secondary | ICD-10-CM

## 2023-11-15 DIAGNOSIS — Z148 Genetic carrier of other disease: Secondary | ICD-10-CM

## 2023-11-15 DIAGNOSIS — Z3480 Encounter for supervision of other normal pregnancy, unspecified trimester: Secondary | ICD-10-CM

## 2023-11-15 DIAGNOSIS — Z9889 Other specified postprocedural states: Secondary | ICD-10-CM

## 2023-11-15 DIAGNOSIS — Z23 Encounter for immunization: Secondary | ICD-10-CM

## 2023-11-15 NOTE — Progress Notes (Addendum)
 ROB/GTT/TDAP.   TDap Vaccine given in LD, tolerated well.

## 2023-11-15 NOTE — Progress Notes (Signed)
   PRENATAL VISIT NOTE  Subjective:  Sonya Stevens is a 23 y.o. G2P1001 at [redacted]w[redacted]d being seen today for ongoing prenatal care.  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  Patient reports doing well overall, just worried about her US  results.  Contractions: Irritability. Vag. Bleeding: None.  Movement: Present. Denies leaking of fluid.   The following portions of the patient's history were reviewed and updated as appropriate: allergies, current medications, past family history, past medical history, past social history, past surgical history and problem list.   Objective:   Vitals:   11/15/23 0924  BP: 97/61  Pulse: 91  Weight: 132 lb (59.9 kg)    Fetal Status: Fetal Heart Rate (bpm): 147   Movement: Present     General:  Alert, oriented and cooperative. Patient is in no acute distress.  Skin: Skin is warm and dry. No rash noted.   Cardiovascular: Normal heart rate noted  Respiratory: Normal respiratory effort, no problems with respiration noted  Abdomen: Soft, gravid, appropriate for gestational age.  Pain/Pressure: Present      Assessment and Plan:  Pregnancy: G2P1001 at [redacted]w[redacted]d 1. Supervision of other normal pregnancy, antepartum (Primary) 2. [redacted] weeks gestation of pregnancy - Glucose Tolerance, 2 Hours w/1 Hour - RPR - CBC - HIV antibody (with reflex) - Tdap vaccine greater than or equal to 7yo IM  3. Polyhydramnios in third trimester complication, single or unspecified fetus 4. 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 Fetal echogenic intracardiac focus on prenatal ultrasound LR NIPS Growth US  6/30 14%ile  from 68%ile in the prior US , new mild poly with AFI 28 GDM screening today Serial growth US  - next 7/30; may need antenatal testing depending on how poly/growth evolves  6. Increase risk of being a carrier of SMA 7. Alpha thalassemia silent carrier Partner declines testing  8. 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   Return in about 2 weeks (around 11/29/2023) for return OB at 31 weeks.  Future Appointments  Date Time Provider Department Center  11/15/2023 11:15 AM Erik Kieth BROCKS, MD [facility] None  12/08/2023 11:15 AM [facility] PROVIDER 1 [facility]  12/08/2023 11:30 AM [facility] US5 [facility]   Kieth BROCKS Erik, MD

## 2023-11-16 ENCOUNTER — Ambulatory Visit: Payer: Self-pay | Admitting: Obstetrics and Gynecology

## 2023-11-16 DIAGNOSIS — Z3480 Encounter for supervision of other normal pregnancy, unspecified trimester: Secondary | ICD-10-CM

## 2023-11-16 LAB — GLUCOSE TOLERANCE, 2 HOURS W/ 1HR
Glucose, 1 hour: 111 mg/dL (ref 70–179)
Glucose, 2 hour: 84 mg/dL (ref 70–152)
Glucose, Fasting: 74 mg/dL (ref 70–91)

## 2023-11-17 LAB — CBC
Hematocrit: 29.6 % — ABNORMAL LOW (ref 34.0–46.6)
Hemoglobin: 9.6 g/dL — ABNORMAL LOW (ref 11.1–15.9)
MCH: 29 pg (ref 26.6–33.0)
MCHC: 32.4 g/dL (ref 31.5–35.7)
MCV: 89 fL (ref 79–97)
Platelets: 256 x10E3/uL (ref 150–450)
RBC: 3.31 x10E6/uL — ABNORMAL LOW (ref 3.77–5.28)
RDW: 12 % (ref 11.7–15.4)
WBC: 9 x10E3/uL (ref 3.4–10.8)

## 2023-11-17 LAB — RPR: RPR Ser Ql: NONREACTIVE

## 2023-11-17 LAB — HIV ANTIBODY (ROUTINE TESTING W REFLEX): HIV Screen 4th Generation wRfx: NONREACTIVE

## 2023-11-18 ENCOUNTER — Other Ambulatory Visit: Payer: Self-pay

## 2023-11-18 MED ORDER — FERROUS SULFATE 325 (65 FE) MG PO TABS: 325.0000 mg | ORAL_TABLET | ORAL | 2 refills | Status: DC

## 2023-11-29 ENCOUNTER — Encounter: Admitting: Obstetrics and Gynecology

## 2023-12-03 ENCOUNTER — Inpatient Hospital Stay (HOSPITAL_COMMUNITY)
Admission: AD | Admit: 2023-12-03 | Discharge: 2023-12-03 | Disposition: A | Discharge status: Home / Self Care | Attending: Obstetrics & Gynecology | Admitting: Obstetrics & Gynecology

## 2023-12-03 ENCOUNTER — Other Ambulatory Visit: Payer: Self-pay

## 2023-12-03 ENCOUNTER — Encounter (HOSPITAL_COMMUNITY): Payer: Self-pay | Admitting: Obstetrics & Gynecology

## 2023-12-03 DIAGNOSIS — O23593 Infection of other part of genital tract in pregnancy, third trimester: Secondary | ICD-10-CM | POA: Diagnosis not present

## 2023-12-03 DIAGNOSIS — R519 Headache, unspecified: Secondary | ICD-10-CM | POA: Diagnosis present

## 2023-12-03 DIAGNOSIS — Z3A31 31 weeks gestation of pregnancy: Secondary | ICD-10-CM | POA: Insufficient documentation

## 2023-12-03 DIAGNOSIS — B9689 Other specified bacterial agents as the cause of diseases classified elsewhere: Secondary | ICD-10-CM | POA: Diagnosis not present

## 2023-12-03 DIAGNOSIS — O26893 Other specified pregnancy related conditions, third trimester: Secondary | ICD-10-CM | POA: Insufficient documentation

## 2023-12-03 DIAGNOSIS — N760 Acute vaginitis: Secondary | ICD-10-CM

## 2023-12-03 LAB — URINALYSIS, ROUTINE W REFLEX MICROSCOPIC
Bilirubin Urine: NEGATIVE
Glucose, UA: NEGATIVE mg/dL
Hgb urine dipstick: NEGATIVE
Ketones, ur: NEGATIVE mg/dL
Nitrite: NEGATIVE
Protein, ur: NEGATIVE mg/dL
Specific Gravity, Urine: 1.013 (ref 1.005–1.030)
pH: 7 (ref 5.0–8.0)

## 2023-12-03 LAB — WET PREP, GENITAL
Sperm: NONE SEEN
Trich, Wet Prep: NONE SEEN
WBC, Wet Prep HPF POC: <10 (ref <10)
Yeast Wet Prep HPF POC: NONE SEEN

## 2023-12-03 MED ORDER — METRONIDAZOLE 500 MG PO TABS: 500.0000 mg | ORAL_TABLET | Freq: Two times a day (BID) | ORAL | 0 refills | Status: AC

## 2023-12-03 MED ORDER — ACETAMINOPHEN-CAFFEINE 500-65 MG PO TABS
2.0000 | ORAL_TABLET | Freq: Once | ORAL | Status: AC
  Administered 2023-12-03: 2 via ORAL
  Filled 2023-12-03: qty 2

## 2023-12-03 NOTE — MAU Provider Note (Signed)
 Chief Complaint:  Headache and Abdominal Pain   HPI     Sonya Stevens is a 23 y.o. G2P1001 at [redacted]w[redacted]d who presents to maternity admissions reporting having headache today with some lower abdominal cramping.  Patient states she did not take Tylenol  or anything for the headaches and she was not sure if she could have the medication.  She denies vaginal bleeding leaking of fluid and reports good fetal movements.  Patient is also reporting some yellowish vaginal discharge.   Pregnancy Course: [facility]  Past Medical History:  Diagnosis Date   Anemia    OB History  Gravida Para Term Preterm AB Living  2 1 1   1   SAB IAB Ectopic Multiple Live Births      1    # Outcome Date GA Lbr Len/2nd Weight Sex Type Anes PTL Lv  2 Current           1 Term 05/18/21 [redacted]w[redacted]d   M Vag-Spont   LIV   Past Surgical History:  Procedure Laterality Date   ILEOSTOMY  1999/10/29   2 wks old, removed part of small intestine   Family History  Problem Relation Age of Onset   Healthy Mother    Healthy Father    Social History   Tobacco Use   Smoking status: Never   Smokeless tobacco: Never  Vaping Use   Vaping status: Never Used  Substance Use Topics   Alcohol use: Not Currently   Drug use: Not Currently    Types: Marijuana    Comment: Last smoked March 2022   No Known Allergies Medications Prior to Admission  Medication Sig Dispense Refill Last Dose/Taking   acetaminophen  (TYLENOL ) 500 MG tablet Take 500 mg by mouth every 6 (six) hours as needed.      Blood Pressure Monitoring (BLOOD PRESSURE KIT) DEVI 1 Device by Does not apply route once a week. (Patient not taking: Reported on 10/22/2023) 1 each 0    Blood Pressure Monitoring (BLOOD PRESSURE KIT) DEVI 1 Device by Does not apply route once a week. (Patient not taking: Reported on 10/22/2023) 1 each 0    cyclobenzaprine  (FLEXERIL ) 10 MG tablet Take 1 tablet (10 mg total) by mouth 2 (two) times daily as needed for muscle spasms. (Patient not  taking: Reported on 10/22/2023) 20 tablet 0    Doxylamine -Pyridoxine  (DICLEGIS ) 10-10 MG TBEC Take 2 tablets by mouth at bedtime. If symptoms persist, add one tablet in the morning and one in the afternoon (Patient not taking: Reported on 10/22/2023) 100 tablet 5    Doxylamine -Pyridoxine  ER (BONJESTA ) 20-20 MG TBCR Take 1 tablet by mouth at bedtime. (Patient not taking: Reported on 10/22/2023) 30 tablet 2    famotidine  (PEPCID ) 20 MG tablet Take 1 tablet (20 mg total) by mouth 2 (two) times daily. (Patient not taking: Reported on 10/22/2023) 60 tablet 0    ferrous sulfate  (FERROUSUL) 325 (65 FE) MG tablet Take 1 tablet (325 mg total) by mouth every other day. (Patient not taking: Reported on 10/22/2023) 60 tablet 1    ferrous sulfate  325 (65 FE) MG EC tablet Take 325 mg by mouth 3 (three) times daily with meals. (Patient not taking: Reported on 10/22/2023)      ferrous sulfate  325 (65 FE) MG tablet Take 1 tablet (325 mg total) by mouth every other day. 30 tablet 2    glycopyrrolate  (ROBINUL ) 2 MG tablet Take 1 tablet (2 mg total) by mouth 3 (three) times daily as needed. (Patient not  taking: Reported on 10/22/2023) 30 tablet 3    metoCLOPramide  (REGLAN ) 10 MG tablet Take 1 tablet (10 mg total) by mouth every 6 (six) hours. (Patient not taking: Reported on 10/22/2023) 30 tablet 0    ondansetron  (ZOFRAN ) 4 MG tablet Take 1 tablet (4 mg total) by mouth every 8 (eight) hours as needed for nausea or vomiting. (Patient not taking: Reported on 10/22/2023) 30 tablet 2    potassium chloride  SA (KLOR-CON  M) 20 MEQ tablet Take 1 tablet (20 mEq total) by mouth 2 (two) times daily for 2 days. (Patient not taking: Reported on 10/22/2023) 4 tablet 0    Prenatal Vit-Fe Fumarate-FA (PRENATAL VITAMIN PO) Take 1 tablet by mouth daily.      progesterone  (PROMETRIUM ) 200 MG capsule Place 1 capsule (200 mg total) vaginally at bedtime. (Patient not taking: Reported on 10/22/2023) 30 capsule 0    promethazine  (PHENERGAN ) 25 MG tablet  Take 1 tablet (25 mg total) by mouth every 6 (six) hours as needed for nausea or vomiting. (Patient not taking: Reported on 10/22/2023) 30 tablet 2     I have reviewed patient's Past Medical Hx, Surgical Hx, Family Hx, Social Hx, medications and allergies.   ROS  Pertinent items noted in HPI and remainder of comprehensive ROS otherwise negative.   PHYSICAL EXAM  Patient Vitals for the past 24 hrs:  BP Temp Temp src Pulse Resp SpO2 Height Weight  12/03/23 2315 (!) 99/58 97.9 F (36.6 C) Oral 74 17 100 % -- --  12/03/23 2134 (!) 90/56 97.8 F (36.6 C) Oral 84 17 100 % -- --  12/03/23 2104 103/60 98.8 F (37.1 C) Oral 80 16 100 % 5' 2 (1.575 m) 60.8 kg    Constitutional: Well-developed, well-nourished female in no acute distress and appears well Cardiovascular: normal rate & rhythm, warm and well-perfused Respiratory: normal effort, no problems with respiration noted GI: Abd soft, non-tender, gravid MS: Extremities nontender, no edema, normal ROM Neurologic: Alert and oriented x 4.  GU: no CVA tenderness Pelvic:  Deferred ( Blind swabs sent)     Fetal Tracing: Cat 1 reactive for GA Baseline: 135-140 Variability:moderate  Accelerations: present Decelerations: absent Toco: irregular ctx not appreciated by patient   Labs: Results for orders placed or performed during the hospital encounter of 12/03/23 (from the past 24 hours)  Urinalysis, Routine w reflex microscopic -Urine, Clean Catch     Status: Abnormal   Collection Time: 12/03/23  9:11 PM  Result Value Ref Range   Color, Urine YELLOW YELLOW   APPearance HAZY (A) CLEAR   Specific Gravity, Urine 1.013 1.005 - 1.030   pH 7.0 5.0 - 8.0   Glucose, UA NEGATIVE NEGATIVE mg/dL   Hgb urine dipstick NEGATIVE NEGATIVE   Bilirubin Urine NEGATIVE NEGATIVE   Ketones, ur NEGATIVE NEGATIVE mg/dL   Protein, ur NEGATIVE NEGATIVE mg/dL   Nitrite NEGATIVE NEGATIVE   Leukocytes,Ua MODERATE (A) NEGATIVE   RBC / HPF 0-5 0 - 5 RBC/hpf    WBC, UA 6-10 0 - 5 WBC/hpf   Bacteria, UA RARE (A) NONE SEEN   Squamous Epithelial / HPF 11-20 0 - 5 /HPF   Mucus PRESENT   Wet prep, genital     Status: Abnormal   Collection Time: 12/03/23  9:51 PM  Result Value Ref Range   Yeast Wet Prep HPF POC NONE SEEN NONE SEEN   Trich, Wet Prep NONE SEEN NONE SEEN   Clue Cells Wet Prep HPF POC PRESENT (A) NONE SEEN  WBC, Wet Prep HPF POC <10 <10   Sperm NONE SEEN     Imaging:  No results found.  MDM & MAU COURSE  MDM:  HIGH  Prenatal records reviewed Physical exam performed Vaginal swabs obtained: Consistent with BV, patient informed the nurse that she had a prescription for Flagyl  which she never filled nor take took.  Will resend new prescription for BV  UA unremarkable NST for gestational age and fetal reassurance: Category 1 reactive Excedrin given for headache: With good resolve Will plan for discharge at this time   MAU Course: Orders Placed This Encounter  Procedures   Wet prep, genital   Urinalysis, Routine w reflex microscopic -Urine, Clean Catch   Discharge patient Discharge disposition: 01-Home or Self Care; Discharge patient date: 12/03/2023   Meds ordered this encounter  Medications   acetaminophen -caffeine  (EXCEDRIN TENSION HEADACHE) 500-65 MG per tablet 2 tablet   metroNIDAZOLE  (FLAGYL ) 500 MG tablet    Sig: Take 1 tablet (500 mg total) by mouth 2 (two) times daily for 7 days.    Dispense:  14 tablet    Refill:  0    Supervising Provider:   PRATT, TANYA S [[id number]]      I have reviewed the patient chart and performed the physical exam . I have ordered & interpreted the lab results and reviewed and interpreted the NST Medications ordered as stated below.  A/P as described below.  Counseling and education provided and patient agreeable  with plan as described below. Verbalized understanding.    ASSESSMENT   1. Nonintractable headache, unspecified chronicity pattern, unspecified headache type   2. Bacterial  vaginosis   3. [redacted] weeks gestation of pregnancy     PLAN  Discharge home in stable condition with return precautions.   See AVS for full description of information given to the patient including both verbal and written. Patient verbalized understanding and agrees with the plan as described above.      Allergies as of 12/03/2023   No Known Allergies      Medication List     TAKE these medications    acetaminophen  500 MG tablet Commonly known as: TYLENOL  Take 500 mg by mouth every 6 (six) hours as needed.   Blood Pressure Kit Devi 1 Device by Does not apply route once a week.   Blood Pressure Kit Devi 1 Device by Does not apply route once a week.   cyclobenzaprine  10 MG tablet Commonly known as: FLEXERIL  Take 1 tablet (10 mg total) by mouth 2 (two) times daily as needed for muscle spasms.   Doxylamine -Pyridoxine  10-10 MG Tbec Commonly known as: Diclegis  Take 2 tablets by mouth at bedtime. If symptoms persist, add one tablet in the morning and one in the afternoon   Bonjesta  20-20 MG Tbcr Generic drug: Doxylamine -Pyridoxine  ER Take 1 tablet by mouth at bedtime.   famotidine  20 MG tablet Commonly known as: Pepcid  Take 1 tablet (20 mg total) by mouth 2 (two) times daily.   ferrous sulfate  325 (65 FE) MG EC tablet Take 325 mg by mouth 3 (three) times daily with meals.   ferrous sulfate  325 (65 FE) MG tablet Commonly known as: FerrouSul Take 1 tablet (325 mg total) by mouth every other day.   ferrous sulfate  325 (65 FE) MG tablet Take 1 tablet (325 mg total) by mouth every other day.   glycopyrrolate  2 MG tablet Commonly known as: ROBINUL  Take 1 tablet (2 mg total) by mouth 3 (three) times daily as  needed.   metoCLOPramide  10 MG tablet Commonly known as: REGLAN  Take 1 tablet (10 mg total) by mouth every 6 (six) hours.   metroNIDAZOLE  500 MG tablet Commonly known as: FLAGYL  Take 1 tablet (500 mg total) by mouth 2 (two) times daily for 7 days.   ondansetron  4  MG tablet Commonly known as: Zofran  Take 1 tablet (4 mg total) by mouth every 8 (eight) hours as needed for nausea or vomiting.   potassium chloride  SA 20 MEQ tablet Commonly known as: KLOR-CON  M Take 1 tablet (20 mEq total) by mouth 2 (two) times daily for 2 days.   PRENATAL VITAMIN PO Take 1 tablet by mouth daily.   progesterone  200 MG capsule Commonly known as: Prometrium  Place 1 capsule (200 mg total) vaginally at bedtime.   promethazine  25 MG tablet Commonly known as: PHENERGAN  Take 1 tablet (25 mg total) by mouth every 6 (six) hours as needed for nausea or vomiting.        Olam Dalton, MSN, [facility], [facility] [employer]

## 2023-12-03 NOTE — MAU Note (Signed)
 Sonya Stevens is a 23 y.o. at [redacted]w[redacted]d here in MAU reporting: started having bad headaches yesterday with lower abdominal cramping. Wanted to make sure it was ok to take tylenol  before taking anything, so she has not taken medication for pain. Denies VB or LOF. Reports a lot of yellow discharge - I've been having that for a minute - reports having odor and irritation. +FM   LMP: NA Onset of complaint: yesterday  Pain score: 7 - HA; 5 - abdomen.  Vitals:   12/03/23 2104  BP: 103/60  Pulse: 80  Resp: 16  Temp: 98.8 F (37.1 C)  SpO2: 100%     FHT: 157  Lab orders placed from triage: UA

## 2023-12-06 LAB — GC/CHLAMYDIA PROBE AMP (~~LOC~~) NOT AT ARMC
Chlamydia: NEGATIVE
Comment: NEGATIVE
Comment: NORMAL
Neisseria Gonorrhea: NEGATIVE

## 2023-12-08 ENCOUNTER — Ambulatory Visit

## 2023-12-24 ENCOUNTER — Ambulatory Visit (HOSPITAL_BASED_OUTPATIENT_CLINIC_OR_DEPARTMENT_OTHER)

## 2023-12-24 ENCOUNTER — Ambulatory Visit: Attending: Obstetrics and Gynecology | Admitting: Maternal & Fetal Medicine

## 2023-12-24 ENCOUNTER — Other Ambulatory Visit: Payer: Self-pay | Admitting: *Deleted

## 2023-12-24 VITALS — BP 111/69 | HR 91

## 2023-12-24 DIAGNOSIS — O99013 Anemia complicating pregnancy, third trimester: Secondary | ICD-10-CM

## 2023-12-24 DIAGNOSIS — O43122 Velamentous insertion of umbilical cord, second trimester: Secondary | ICD-10-CM | POA: Diagnosis not present

## 2023-12-24 DIAGNOSIS — O403XX0 Polyhydramnios, third trimester, not applicable or unspecified: Secondary | ICD-10-CM | POA: Diagnosis not present

## 2023-12-24 DIAGNOSIS — O43123 Velamentous insertion of umbilical cord, third trimester: Secondary | ICD-10-CM

## 2023-12-24 DIAGNOSIS — D563 Thalassemia minor: Secondary | ICD-10-CM | POA: Diagnosis not present

## 2023-12-24 DIAGNOSIS — Z3A34 34 weeks gestation of pregnancy: Secondary | ICD-10-CM | POA: Insufficient documentation

## 2023-12-24 DIAGNOSIS — Z3480 Encounter for supervision of other normal pregnancy, unspecified trimester: Secondary | ICD-10-CM

## 2023-12-24 DIAGNOSIS — O283 Abnormal ultrasonic finding on antenatal screening of mother: Secondary | ICD-10-CM

## 2023-12-24 DIAGNOSIS — Z3689 Encounter for other specified antenatal screening: Secondary | ICD-10-CM | POA: Diagnosis not present

## 2023-12-24 DIAGNOSIS — O358XX0 Maternal care for other (suspected) fetal abnormality and damage, not applicable or unspecified: Secondary | ICD-10-CM

## 2023-12-24 DIAGNOSIS — O409XX0 Polyhydramnios, unspecified trimester, not applicable or unspecified: Secondary | ICD-10-CM

## 2023-12-24 NOTE — Progress Notes (Signed)
 After review, MFM consult with provider is not indicated for today  William Glenn, DO 12/24/2023 1:58 PM  [facility]

## 2024-01-06 ENCOUNTER — Ambulatory Visit: Admitting: Obstetrics and Gynecology

## 2024-01-06 ENCOUNTER — Other Ambulatory Visit (HOSPITAL_COMMUNITY)
Admission: RE | Admit: 2024-01-06 | Discharge: 2024-01-06 | Disposition: A | Discharge status: Home / Self Care | Source: Ambulatory Visit | Attending: Obstetrics and Gynecology | Admitting: Obstetrics and Gynecology

## 2024-01-06 ENCOUNTER — Encounter: Payer: Self-pay | Admitting: Obstetrics and Gynecology

## 2024-01-06 VITALS — BP 107/69 | HR 97 | Wt 141.4 lb

## 2024-01-06 DIAGNOSIS — O43122 Velamentous insertion of umbilical cord, second trimester: Secondary | ICD-10-CM

## 2024-01-06 DIAGNOSIS — O43123 Velamentous insertion of umbilical cord, third trimester: Secondary | ICD-10-CM

## 2024-01-06 DIAGNOSIS — Z3480 Encounter for supervision of other normal pregnancy, unspecified trimester: Secondary | ICD-10-CM | POA: Insufficient documentation

## 2024-01-06 DIAGNOSIS — Z9889 Other specified postprocedural states: Secondary | ICD-10-CM

## 2024-01-06 DIAGNOSIS — Z3A36 36 weeks gestation of pregnancy: Secondary | ICD-10-CM | POA: Diagnosis not present

## 2024-01-06 NOTE — Progress Notes (Signed)
 ROB; 36 week swabs.  Would like to make a dentist appt due to tooth aches, dentist told pt she needed a note from OB.

## 2024-01-06 NOTE — Progress Notes (Signed)
   PRENATAL VISIT NOTE  Subjective:  Sonya Stevens is a 23 y.o. G2P1001 at [redacted]w[redacted]d being seen today for ongoing prenatal care.  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  Patient reports no complaints.  Contractions: Irritability. Vag. Bleeding: None.  Movement: Present. Denies leaking of fluid.   The following portions of the patient's history were reviewed and updated as appropriate: allergies, current medications, past family history, past medical history, past social history, past surgical history and problem list.   Objective:    Vitals:   01/06/24 1345  BP: 107/69  Pulse: 97  Weight: 141 lb 6.4 oz (64.1 kg)    Fetal Status:  Fetal Heart Rate (bpm): 158   Movement: Present Presentation: Vertex  General: Alert, oriented and cooperative. Patient is in no acute distress.  Skin: Skin is warm and dry. No rash noted.   Cardiovascular: Normal heart rate noted  Respiratory: Normal respiratory effort, no problems with respiration noted  Abdomen: Soft, gravid, appropriate for gestational age.  Pain/Pressure: Present     Pelvic: Cervical exam deferred        Extremities: Normal range of motion.  Edema: None  Mental Status: Normal mood and affect. Normal behavior. Normal judgment and thought content.   Assessment and Plan:  Pregnancy: G2P1001 at [redacted]w[redacted]d 1. Supervision of other normal pregnancy, antepartum (Primary) BP and FHR normal Doing well, feeling regular movement    2. 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 Hx of abdominal surgery   4. [redacted] weeks gestation  of pregnancy Swabs collected today  - Cervicovaginal ancillary only( ) - Culture, beta strep (group b only)   Preterm labor symptoms and general obstetric precautions including but not limited to vaginal bleeding, contractions, leaking of fluid and fetal movement were reviewed in detail with the patient. Please refer to After Visit Summary for other counseling recommendations.   Return in about 1 week (around 01/13/2024) for OB VISIT (MD or APP).  Future Appointments  Date Time Provider Department Center  01/13/2024  2:50 PM Fredirick Glenys RAMAN, MD [facility] None  01/20/2024  2:00 PM [facility] PROVIDER 1 [facility]  01/20/2024  2:15 PM [facility] US3 [facility]    Nidia Daring, FNP

## 2024-01-07 LAB — CERVICOVAGINAL ANCILLARY ONLY
Bacterial Vaginitis (gardnerella): NEGATIVE
Candida Glabrata: NEGATIVE
Candida Vaginitis: NEGATIVE
Chlamydia: NEGATIVE
Comment: NEGATIVE
Comment: NEGATIVE
Comment: NEGATIVE
Comment: NEGATIVE
Comment: NEGATIVE
Comment: NORMAL
Neisseria Gonorrhea: NEGATIVE
Trichomonas: NEGATIVE

## 2024-01-08 ENCOUNTER — Ambulatory Visit: Payer: Self-pay | Admitting: Obstetrics and Gynecology

## 2024-01-10 LAB — CULTURE, BETA STREP (GROUP B ONLY): Strep Gp B Culture: NEGATIVE

## 2024-01-13 ENCOUNTER — Ambulatory Visit (INDEPENDENT_AMBULATORY_CARE_PROVIDER_SITE_OTHER): Admitting: Family Medicine

## 2024-01-13 VITALS — BP 104/68 | HR 88 | Wt 141.0 lb

## 2024-01-13 DIAGNOSIS — Z3A37 37 weeks gestation of pregnancy: Secondary | ICD-10-CM

## 2024-01-13 DIAGNOSIS — O43122 Velamentous insertion of umbilical cord, second trimester: Secondary | ICD-10-CM | POA: Diagnosis not present

## 2024-01-13 DIAGNOSIS — Z3480 Encounter for supervision of other normal pregnancy, unspecified trimester: Secondary | ICD-10-CM

## 2024-01-13 NOTE — Progress Notes (Signed)
 ROB, c/o pelvic pain 7/10.  Pt will wait to get FLU vaccine.   Pt wants her cervix checked.

## 2024-01-13 NOTE — Progress Notes (Signed)
   PRENATAL VISIT NOTE  Subjective:  Sonya Stevens is a 23 y.o. G2P1001 at [redacted]w[redacted]d being seen today for ongoing prenatal care.  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  Patient reports no complaints.  Contractions: Irritability. Vag. Bleeding: None.  Movement: Present. Denies leaking of fluid.   The following portions of the patient's history were reviewed and updated as appropriate: allergies, current medications, past family history, past medical history, past social history, past surgical history and problem list.   Objective:    Vitals:   01/13/24 1520  BP: 104/68  Pulse: 88  Weight: 141 lb (64 kg)    Fetal Status:  Fetal Heart Rate (bpm): 156 Fundal Height: 34 cm Movement: Present Presentation: Vertex  General: Alert, oriented and cooperative. Patient is in no acute distress.  Skin: Skin is warm and dry. No rash noted.   Cardiovascular: Normal heart rate noted  Respiratory: Normal respiratory effort, no problems with respiration noted  Abdomen: Soft, gravid, appropriate for gestational age.  Pain/Pressure: Present     Pelvic: Cervical exam performed in the presence of a chaperone Dilation: 1.5 Effacement (%): 20    Extremities: Normal range of motion.  Edema: None  Mental Status: Normal mood and affect. Normal behavior. Normal judgment and thought content.   Assessment and Plan:  Pregnancy: G2P1001 at [redacted]w[redacted]d 1. 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 Supervision of other normal pregnancy, antepartum Continue prenatal care.  Preterm labor symptoms and general obstetric  precautions including but not limited to vaginal bleeding, contractions, leaking of fluid and fetal movement were reviewed in detail with the patient. Please refer to After Visit Summary for other counseling recommendations.   Return in 1 week (on 01/20/2024).  Future Appointments  Date Time Provider Department Center  01/20/2024  2:00 PM [facility] PROVIDER 1 [facility]  01/20/2024  2:15 PM [facility] US3 [facility]  01/21/2024 11:15 AM Rudy Carlin LABOR, MD [facility] None    Glenys GORMAN Birk, MD

## 2024-01-15 ENCOUNTER — Inpatient Hospital Stay (HOSPITAL_COMMUNITY): Admitting: Anesthesiology

## 2024-01-15 ENCOUNTER — Other Ambulatory Visit: Payer: Self-pay

## 2024-01-15 ENCOUNTER — Encounter (HOSPITAL_COMMUNITY): Payer: Self-pay

## 2024-01-15 ENCOUNTER — Inpatient Hospital Stay (HOSPITAL_COMMUNITY)
Admission: AD | Admit: 2024-01-15 | Discharge: 2024-01-18 | DRG: 786 | Disposition: A | Discharge status: Home / Self Care | Attending: Obstetrics and Gynecology | Admitting: Obstetrics and Gynecology

## 2024-01-15 ENCOUNTER — Encounter (HOSPITAL_COMMUNITY): Payer: Self-pay | Admitting: Obstetrics and Gynecology

## 2024-01-15 ENCOUNTER — Encounter (HOSPITAL_COMMUNITY)
Admission: AD | Disposition: A | Discharge status: Home / Self Care | Payer: Self-pay | Source: Home / Self Care | Attending: Obstetrics and Gynecology

## 2024-01-15 DIAGNOSIS — O4593 Premature separation of placenta, unspecified, third trimester: Secondary | ICD-10-CM

## 2024-01-15 DIAGNOSIS — D563 Thalassemia minor: Secondary | ICD-10-CM | POA: Diagnosis present

## 2024-01-15 DIAGNOSIS — D62 Acute posthemorrhagic anemia: Secondary | ICD-10-CM | POA: Insufficient documentation

## 2024-01-15 DIAGNOSIS — O458X9 Other premature separation of placenta, unspecified trimester: Secondary | ICD-10-CM | POA: Diagnosis present

## 2024-01-15 DIAGNOSIS — O368390 Maternal care for abnormalities of the fetal heart rate or rhythm, unspecified trimester, not applicable or unspecified: Secondary | ICD-10-CM | POA: Diagnosis present

## 2024-01-15 DIAGNOSIS — O43123 Velamentous insertion of umbilical cord, third trimester: Secondary | ICD-10-CM | POA: Diagnosis present

## 2024-01-15 DIAGNOSIS — Z148 Genetic carrier of other disease: Secondary | ICD-10-CM | POA: Diagnosis not present

## 2024-01-15 DIAGNOSIS — Z3480 Encounter for supervision of other normal pregnancy, unspecified trimester: Principal | ICD-10-CM

## 2024-01-15 DIAGNOSIS — O9962 Diseases of the digestive system complicating childbirth: Secondary | ICD-10-CM | POA: Diagnosis present

## 2024-01-15 DIAGNOSIS — O479 False labor, unspecified: Secondary | ICD-10-CM | POA: Diagnosis present

## 2024-01-15 DIAGNOSIS — K219 Gastro-esophageal reflux disease without esophagitis: Secondary | ICD-10-CM | POA: Diagnosis present

## 2024-01-15 DIAGNOSIS — Z9889 Other specified postprocedural states: Secondary | ICD-10-CM

## 2024-01-15 DIAGNOSIS — O9081 Anemia of the puerperium: Secondary | ICD-10-CM | POA: Diagnosis not present

## 2024-01-15 DIAGNOSIS — O4590 Premature separation of placenta, unspecified, unspecified trimester: Secondary | ICD-10-CM | POA: Diagnosis present

## 2024-01-15 DIAGNOSIS — Z3A37 37 weeks gestation of pregnancy: Secondary | ICD-10-CM

## 2024-01-15 DIAGNOSIS — Z98891 History of uterine scar from previous surgery: Secondary | ICD-10-CM | POA: Diagnosis not present

## 2024-01-15 DIAGNOSIS — O43122 Velamentous insertion of umbilical cord, second trimester: Secondary | ICD-10-CM | POA: Diagnosis present

## 2024-01-15 DIAGNOSIS — Z8759 Personal history of other complications of pregnancy, childbirth and the puerperium: Secondary | ICD-10-CM | POA: Diagnosis present

## 2024-01-15 LAB — CBC
HCT: 33.4 % — ABNORMAL LOW (ref 36.0–46.0)
Hemoglobin: 10.4 g/dL — ABNORMAL LOW (ref 12.0–15.0)
MCH: 28.3 pg (ref 26.0–34.0)
MCHC: 31.1 g/dL (ref 30.0–36.0)
MCV: 90.8 fL (ref 80.0–100.0)
Platelets: 334 K/uL (ref 150–400)
RBC: 3.68 MIL/uL — ABNORMAL LOW (ref 3.87–5.11)
RDW: 14 % (ref 11.5–15.5)
WBC: 9.9 K/uL (ref 4.0–10.5)
nRBC: 0 % (ref 0.0–0.2)

## 2024-01-15 LAB — POCT FERN TEST: POCT Fern Test: POSITIVE

## 2024-01-15 LAB — TYPE AND SCREEN
ABO/RH(D): B POS
Antibody Screen: NEGATIVE

## 2024-01-15 SURGERY — Surgical Case
Anesthesia: General

## 2024-01-15 MED ORDER — ARTIFICIAL TEARS OPHTHALMIC OINT
TOPICAL_OINTMENT | OPHTHALMIC | Status: AC
  Filled 2024-01-15: qty 3.5

## 2024-01-15 MED ORDER — FENTANYL CITRATE (PF) 100 MCG/2ML IJ SOLN: 50.0000 ug | INTRAMUSCULAR | Status: DC | PRN

## 2024-01-15 MED ORDER — CEFAZOLIN SODIUM-DEXTROSE 2-3 GM-%(50ML) IV SOLR
INTRAVENOUS | Status: DC | PRN
  Administered 2024-01-15: 2 g via INTRAVENOUS

## 2024-01-15 MED ORDER — OXYTOCIN-SODIUM CHLORIDE 30-0.9 UT/500ML-% IV SOLN: 2.5000 [IU]/h | INTRAVENOUS | Status: DC

## 2024-01-15 MED ORDER — DEXMEDETOMIDINE HCL IN NACL 80 MCG/20ML IV SOLN
INTRAVENOUS | Status: DC | PRN
  Administered 2024-01-15: 8 ug via INTRAVENOUS

## 2024-01-15 MED ORDER — SODIUM CHLORIDE 0.9 % IV SOLN: 250.0000 mL | INTRAVENOUS | Status: DC | PRN

## 2024-01-15 MED ORDER — DEXAMETHASONE SODIUM PHOSPHATE 10 MG/ML IJ SOLN
INTRAMUSCULAR | Status: AC
  Filled 2024-01-15: qty 1

## 2024-01-15 MED ORDER — LACTATED RINGERS IV SOLN: INTRAVENOUS | Status: DC | PRN

## 2024-01-15 MED ORDER — ACETAMINOPHEN 325 MG PO TABS: 650.0000 mg | ORAL_TABLET | ORAL | Status: DC | PRN

## 2024-01-15 MED ORDER — TERBUTALINE SULFATE 1 MG/ML IJ SOLN: 0.2500 mg | Freq: Once | INTRAMUSCULAR | Status: DC

## 2024-01-15 MED ORDER — OXYTOCIN BOLUS FROM INFUSION: 333.0000 mL | Freq: Once | INTRAVENOUS | Status: DC

## 2024-01-15 MED ORDER — ONDANSETRON HCL 4 MG/2ML IJ SOLN
INTRAMUSCULAR | Status: DC | PRN
  Administered 2024-01-15: 4 mg via INTRAVENOUS

## 2024-01-15 MED ORDER — SODIUM CHLORIDE 0.9% FLUSH: 3.0000 mL | Freq: Two times a day (BID) | INTRAVENOUS | Status: DC

## 2024-01-15 MED ORDER — DEXAMETHASONE SODIUM PHOSPHATE 10 MG/ML IJ SOLN
INTRAMUSCULAR | Status: DC | PRN
  Administered 2024-01-15: 10 mg via INTRAVENOUS

## 2024-01-15 MED ORDER — SODIUM CHLORIDE 0.9 % IV SOLN
INTRAVENOUS | Status: DC | PRN
  Administered 2024-01-15: 500 mg via INTRAVENOUS

## 2024-01-15 MED ORDER — HYDROMORPHONE HCL 1 MG/ML IJ SOLN
INTRAMUSCULAR | Status: AC
  Filled 2024-01-15: qty 0.5

## 2024-01-15 MED ORDER — OXYCODONE-ACETAMINOPHEN 5-325 MG PO TABS: 2.0000 | ORAL_TABLET | ORAL | Status: DC | PRN

## 2024-01-15 MED ORDER — EPHEDRINE 5 MG/ML INJ
INTRAVENOUS | Status: AC
  Filled 2024-01-15: qty 5

## 2024-01-15 MED ORDER — KETOROLAC TROMETHAMINE 30 MG/ML IJ SOLN
INTRAMUSCULAR | Status: AC
  Filled 2024-01-15: qty 1

## 2024-01-15 MED ORDER — SODIUM CHLORIDE 0.9% FLUSH: 3.0000 mL | INTRAVENOUS | Status: DC | PRN

## 2024-01-15 MED ORDER — LACTATED RINGERS IV SOLN: 500.0000 mL | INTRAVENOUS | Status: DC | PRN

## 2024-01-15 MED ORDER — LIDOCAINE HCL (PF) 1 % IJ SOLN: 30.0000 mL | INTRAMUSCULAR | Status: DC | PRN

## 2024-01-15 MED ORDER — ONDANSETRON HCL 4 MG/2ML IJ SOLN: 4.0000 mg | Freq: Four times a day (QID) | INTRAMUSCULAR | Status: DC | PRN

## 2024-01-15 MED ORDER — PHENYLEPHRINE 80 MCG/ML (10ML) SYRINGE FOR IV PUSH (FOR BLOOD PRESSURE SUPPORT)
PREFILLED_SYRINGE | INTRAVENOUS | Status: AC
  Filled 2024-01-15: qty 10

## 2024-01-15 MED ORDER — LIDOCAINE 2% (20 MG/ML) 5 ML SYRINGE
INTRAMUSCULAR | Status: AC
Start: 2024-01-15 — End: 2024-01-15
  Filled 2024-01-15: qty 5

## 2024-01-15 MED ORDER — SOD CITRATE-CITRIC ACID 500-334 MG/5ML PO SOLN: 30.0000 mL | ORAL | Status: DC | PRN

## 2024-01-15 MED ORDER — ACETAMINOPHEN 10 MG/ML IV SOLN
INTRAVENOUS | Status: DC | PRN
  Administered 2024-01-15: 1000 mg via INTRAVENOUS

## 2024-01-15 MED ORDER — SUCCINYLCHOLINE CHLORIDE 200 MG/10ML IV SOSY
PREFILLED_SYRINGE | INTRAVENOUS | Status: DC | PRN
  Administered 2024-01-15: 100 mg via INTRAVENOUS

## 2024-01-15 MED ORDER — PHENYLEPHRINE HCL-NACL 20-0.9 MG/250ML-% IV SOLN
INTRAVENOUS | Status: DC | PRN
  Administered 2024-01-15: 40 ug/min via INTRAVENOUS

## 2024-01-15 MED ORDER — HYDROXYZINE HCL 50 MG PO TABS: 50.0000 mg | ORAL_TABLET | Freq: Four times a day (QID) | ORAL | Status: DC | PRN

## 2024-01-15 MED ORDER — HYDROMORPHONE HCL 1 MG/ML IJ SOLN
0.2500 mg | INTRAMUSCULAR | Status: DC | PRN
  Administered 2024-01-15 – 2024-01-16 (×2): 0.5 mg via INTRAVENOUS

## 2024-01-15 MED ORDER — LACTATED RINGERS IV SOLN: INTRAVENOUS | Status: DC

## 2024-01-15 MED ORDER — KETOROLAC TROMETHAMINE 30 MG/ML IJ SOLN
30.0000 mg | Freq: Once | INTRAMUSCULAR | Status: AC | PRN
  Administered 2024-01-15: 30 mg via INTRAVENOUS

## 2024-01-15 MED ORDER — ONDANSETRON HCL 4 MG/2ML IJ SOLN
INTRAMUSCULAR | Status: AC
Start: 2024-01-15 — End: 2024-01-15
  Filled 2024-01-15: qty 2

## 2024-01-15 MED ORDER — PROPOFOL 10 MG/ML IV BOLUS
INTRAVENOUS | Status: DC | PRN
  Administered 2024-01-15: 150 mg via INTRAVENOUS

## 2024-01-15 MED ORDER — FENTANYL CITRATE (PF) 250 MCG/5ML IJ SOLN
INTRAMUSCULAR | Status: DC | PRN
  Administered 2024-01-15 (×3): 50 ug via INTRAVENOUS
  Administered 2024-01-15: 100 ug via INTRAVENOUS

## 2024-01-15 MED ORDER — OXYTOCIN-SODIUM CHLORIDE 30-0.9 UT/500ML-% IV SOLN
INTRAVENOUS | Status: DC | PRN
  Administered 2024-01-15: 300 mL via INTRAVENOUS

## 2024-01-15 MED ORDER — TERBUTALINE SULFATE 1 MG/ML IJ SOLN
INTRAMUSCULAR | Status: AC
  Filled 2024-01-15: qty 1

## 2024-01-15 MED ORDER — PROMETHAZINE (PHENERGAN) 6.25MG IN NS 50ML IVPB: 6.2500 mg | INTRAVENOUS | Status: DC | PRN

## 2024-01-15 MED ORDER — OXYCODONE-ACETAMINOPHEN 5-325 MG PO TABS: 1.0000 | ORAL_TABLET | ORAL | Status: DC | PRN

## 2024-01-15 MED ORDER — SUCCINYLCHOLINE CHLORIDE 200 MG/10ML IV SOSY
PREFILLED_SYRINGE | INTRAVENOUS | Status: AC
  Filled 2024-01-15: qty 10

## 2024-01-15 MED ORDER — FENTANYL CITRATE (PF) 250 MCG/5ML IJ SOLN
INTRAMUSCULAR | Status: AC
  Filled 2024-01-15: qty 5

## 2024-01-15 SURGICAL SUPPLY — 32 items
BENZOIN TINCTURE PRP APPL 2/3 (GAUZE/BANDAGES/DRESSINGS) IMPLANT
CHLORAPREP W/TINT 26 (MISCELLANEOUS) ×2 IMPLANT
CLAMP UMBILICAL CORD (MISCELLANEOUS) ×1 IMPLANT
CLOTH BEACON ORANGE TIMEOUT ST (SAFETY) ×1 IMPLANT
DRSG OPSITE POSTOP 4X10 (GAUZE/BANDAGES/DRESSINGS) ×1 IMPLANT
ELECTRODE REM PT RTRN 9FT ADLT (ELECTROSURGICAL) ×1 IMPLANT
EXTRACTOR VACUUM KIWI (MISCELLANEOUS) IMPLANT
GAUZE PAD ABD 7.5X8 STRL (GAUZE/BANDAGES/DRESSINGS) IMPLANT
GAUZE SPONGE 4X4 12PLY STRL (GAUZE/BANDAGES/DRESSINGS) IMPLANT
GLOVE BIO SURGEON STRL SZ 6 (GLOVE) ×1 IMPLANT
GLOVE BIOGEL PI IND STRL 7.0 (GLOVE) ×2 IMPLANT
GOWN STRL REUS W/TWL LRG LVL3 (GOWN DISPOSABLE) ×2 IMPLANT
KIT ABG SYR 3ML LUER SLIP (SYRINGE) IMPLANT
MAT PREVALON FULL STRYKER (MISCELLANEOUS) IMPLANT
NDL HYPO 25X5/8 SAFETYGLIDE (NEEDLE) IMPLANT
NEEDLE HYPO 22GX1.5 SAFETY (NEEDLE) IMPLANT
NEEDLE HYPO 25X5/8 SAFETYGLIDE (NEEDLE) IMPLANT
NS IRRIG 1000ML POUR BTL (IV SOLUTION) ×1 IMPLANT
PACK C SECTION WH (CUSTOM PROCEDURE TRAY) ×1 IMPLANT
PAD OB MATERNITY 4.3X12.25 (PERSONAL CARE ITEMS) ×1 IMPLANT
RETRACTOR WND ALEXIS 25 LRG (MISCELLANEOUS) IMPLANT
STRIP CLOSURE SKIN 1/2X4 (GAUZE/BANDAGES/DRESSINGS) IMPLANT
SUT MON AB 4-0 PS1 27 (SUTURE) ×1 IMPLANT
SUT PLAIN 0 NONE (SUTURE) ×1 IMPLANT
SUT PLAIN ABS 2-0 CT1 27XMFL (SUTURE) IMPLANT
SUT VIC AB 0 CT1 36 (SUTURE) ×1 IMPLANT
SUT VIC AB 0 CTX36XBRD ANBCTRL (SUTURE) ×2 IMPLANT
SUT VIC AB 2-0 CT1 TAPERPNT 27 (SUTURE) ×1 IMPLANT
SYR CONTROL 10ML LL (SYRINGE) IMPLANT
TOWEL OR 17X24 6PK STRL BLUE (TOWEL DISPOSABLE) ×1 IMPLANT
TRAY FOLEY W/BAG SLVR 14FR LF (SET/KITS/TRAYS/PACK) IMPLANT
WATER STERILE IRR 1000ML POUR (IV SOLUTION) ×1 IMPLANT

## 2024-01-15 NOTE — H&P (Signed)
 OBSTETRIC ADMISSION HISTORY AND PHYSICAL  Sonya Stevens is a 23 y.o. female [phone] with IUP at [redacted]w[redacted]d by early US  presenting for early labor with subsequent finding of non-reactive FHR tracing. She reports +FMs, No LOF, no VB, no blurry vision, headaches or peripheral edema, and RUQ pain.  She plans on breast feeding. She request unsure for birth control. She received her prenatal care at [facility]   Dating: By early US  --->  Estimated Date of Delivery: 01/30/24  Sono:    @[redacted]w[redacted]d , CWD, normal anatomy, breech presentation, right lateral placental lie, 297g, 68% EFW   Prenatal History/Complications: MCW - Anemia (HgB 9.6)  - Velamentous cord insertion - Alpha thal carrier - SMA carrier - Prior vaginal delivery  Past Medical History: Past Medical History:  Diagnosis Date   Anemia     Past Surgical History: Past Surgical History:  Procedure Laterality Date   ILEOSTOMY  07-17-99   2 wks old, removed part of small intestine    Obstetrical History: OB History     Gravida  2   Para  2   Term  2   Preterm      AB      Living  2      SAB      IAB      Ectopic      Multiple  0   Live Births  2           Social History Social History   Socioeconomic History   Marital status: Single    Spouse name: Not on file   Number of children: Not on file   Years of education: Not on file   Highest education level: Not on file  Occupational History   Not on file  Tobacco Use   Smoking status: Never   Smokeless tobacco: Never  Vaping Use   Vaping status: Never Used  Substance and Sexual Activity   Alcohol use: Not Currently   Drug use: Not Currently    Types: Marijuana    Comment: Last smoked March 2022   Sexual activity: Yes  Other Topics Concern   Not on file  Social History Narrative   Not on file   Social Drivers of Health   Financial Resource Strain: Not at Risk (02/23/2023)   Received from [employer]    How hard is  it for you to pay for the very basics like food, housing, heating, medical care, and medications?: 1  Food Insecurity: No Food Insecurity (01/15/2024)   Hunger Vital Sign    Worried About Running Out of Food in the Last Year: Never true    Ran Out of Food in the Last Year: Never true  Transportation Needs: No Transportation Needs (01/15/2024)   PRAPARE - Administrator, Civil Service (Medical): No    Lack of Transportation (Non-Medical): No  Physical Activity: Not on File (02/02/2023)   Received from [facility]   Physical Activity    Physical Activity: 0  Stress: Not on File (02/02/2023)   Received from [facility]   Stress    Stress: 0  Social Connections: Socially Isolated (12/03/2023)   Social Connection and Isolation Panel    Frequency of Communication with Friends and Family: Three times a week    Frequency of Social Gatherings with Friends and Family: Once a week    Attends Religious Services: Never    Database administrator or Organizations: No    Attends [employer]  or Organization Meetings: Never    Marital Status: Never married    Family History: Family History  Problem Relation Age of Onset   Healthy Mother    Healthy Father     Allergies: No Known Allergies  Medications Prior to Admission  Medication Sig Dispense Refill Last Dose/Taking   acetaminophen  (TYLENOL ) 500 MG tablet Take 500 mg by mouth every 6 (six) hours as needed.   Past Month   ferrous sulfate  325 (65 FE) MG tablet Take 1 tablet (325 mg total) by mouth every other day. 30 tablet 2 Past Week   Blood Pressure Monitoring (BLOOD PRESSURE KIT) DEVI 1 Device by Does not apply route once a week. (Patient not taking: Reported on 10/22/2023) 1 each 0    Blood Pressure Monitoring (BLOOD PRESSURE KIT) DEVI 1 Device by Does not apply route once a week. (Patient not taking: Reported on 10/22/2023) 1 each 0    cyclobenzaprine  (FLEXERIL ) 10 MG tablet Take 1 tablet (10 mg total) by mouth 2 (two) times daily as needed for muscle  spasms. (Patient not taking: Reported on 10/22/2023) 20 tablet 0    Doxylamine -Pyridoxine  (DICLEGIS ) 10-10 MG TBEC Take 2 tablets by mouth at bedtime. If symptoms persist, add one tablet in the morning and one in the afternoon (Patient not taking: Reported on 10/22/2023) 100 tablet 5    Doxylamine -Pyridoxine  ER (BONJESTA ) 20-20 MG TBCR Take 1 tablet by mouth at bedtime. (Patient not taking: Reported on 10/22/2023) 30 tablet 2    famotidine  (PEPCID ) 20 MG tablet Take 1 tablet (20 mg total) by mouth 2 (two) times daily. (Patient not taking: Reported on 10/22/2023) 60 tablet 0    ferrous sulfate  (FERROUSUL) 325 (65 FE) MG tablet Take 1 tablet (325 mg total) by mouth every other day. (Patient not taking: Reported on 10/22/2023) 60 tablet 1    ferrous sulfate  325 (65 FE) MG EC tablet Take 325 mg by mouth 3 (three) times daily with meals. (Patient not taking: Reported on 10/22/2023)      glycopyrrolate  (ROBINUL ) 2 MG tablet Take 1 tablet (2 mg total) by mouth 3 (three) times daily as needed. (Patient not taking: Reported on 10/22/2023) 30 tablet 3    metoCLOPramide  (REGLAN ) 10 MG tablet Take 1 tablet (10 mg total) by mouth every 6 (six) hours. (Patient not taking: Reported on 10/22/2023) 30 tablet 0    ondansetron  (ZOFRAN ) 4 MG tablet Take 1 tablet (4 mg total) by mouth every 8 (eight) hours as needed for nausea or vomiting. (Patient not taking: Reported on 10/22/2023) 30 tablet 2    potassium chloride  SA (KLOR-CON  M) 20 MEQ tablet Take 1 tablet (20 mEq total) by mouth 2 (two) times daily for 2 days. (Patient not taking: Reported on 10/22/2023) 4 tablet 0    Prenatal Vit-Fe Fumarate-FA (PRENATAL VITAMIN PO) Take 1 tablet by mouth daily.   More than a month   progesterone  (PROMETRIUM ) 200 MG capsule Place 1 capsule (200 mg total) vaginally at bedtime. (Patient not taking: Reported on 10/22/2023) 30 capsule 0    promethazine  (PHENERGAN ) 25 MG tablet Take 1 tablet (25 mg total) by mouth every 6 (six) hours as needed for  nausea or vomiting. (Patient not taking: Reported on 10/22/2023) 30 tablet 2      Review of Systems   All systems reviewed and negative except as stated in HPI  Blood pressure 107/62, pulse (!) 102, temperature (!) 97.5 F (36.4 C), temperature source Oral, resp. rate 16, height 5' 2 (1.575  m), weight 63.7 kg, last menstrual period 04/02/2023, SpO2 100%, unknown if currently breastfeeding. General appearance: severe distress - pt was screaming upon my arrival to the room Lungs: Normal effort, tachypnea Heart: regular rate Abdomen: Uterus was hard, exquisitely tender to palpation Pelvic: 6 cm by my exam, no cord Extremities: Homans sign is negative, no sign of DVT  Presentation: cephalic Fetal monitoring: Baseline was 160s, moderate variability with early and variable decelerations then with prolonged decel to the 60s x7 minutes Uterine activity: Q1-2 min Dilation: 3 Effacement (%): 70, 80 Station: -2 Exam by:: RONAL Freund, RN   Prenatal labs: ABO, Rh: --/--/B POS (09/06 2140) Antibody: NEG (09/06 2140) Rubella: 5.27 (03/17 1151) RPR: Non Reactive (07/07 0945)  HBsAg: Negative (03/17 1151)  HIV: Non Reactive (07/07 0945)  GBS: Negative/-- (08/28 1455)    Lab Results  Component Value Date   GBS Negative 01/06/2024   GTT WNL Genetic screening  LR female Anatomy US  velamentous cord  Immunization History  Administered Date(s) Administered   Influenza, Seasonal, Injecte, Preservative Fre 09/01/2023   Tdap 11/15/2023    Prenatal Transfer Tool  Maternal Diabetes: No Genetic Screening: Normal Maternal Ultrasounds/Referrals: Other: Velamentous cord Fetal Ultrasounds or other Referrals:  Referred to Materal Fetal Medicine  Maternal Substance Abuse:  No Significant Maternal Medications:  None Significant Maternal Lab Results: Group B Strep negative Number of Prenatal Visits:greater than 3 verified prenatal visits Maternal Vaccinations:TDap and Flu Other Comments:   None   Results for orders placed or performed during the hospital encounter of 01/15/24 (from the past 24 hours)  CBC   Collection Time: 01/15/24  9:40 PM  Result Value Ref Range   WBC 9.9 4.0 - 10.5 K/uL   RBC 3.68 (L) 3.87 - 5.11 MIL/uL   Hemoglobin 10.4 (L) 12.0 - 15.0 g/dL   HCT 66.5 (L) 63.9 - 53.9 %   MCV 90.8 80.0 - 100.0 fL   MCH 28.3 26.0 - 34.0 pg   MCHC 31.1 30.0 - 36.0 g/dL   RDW 85.9 88.4 - 84.4 %   Platelets 334 150 - 400 K/uL   nRBC 0.0 0.0 - 0.2 %  Type and screen [facility]   Collection Time: 01/15/24  9:40 PM  Result Value Ref Range   ABO/RH(D) B POS    Antibody Screen NEG    Sample Expiration      01/18/2024,2359 Performed at [facility], 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 G2P2002 at [redacted]w[redacted]d here for SOL in latent phase with subsequent finding of non-reactive fetal heart rate tracing with variable decelerations. While awaiting L&D room, she then had a prolonged deceleration in to the 60s lasting 7 minutes. During this time, given the level of her pain and deceleration, I suspected concealed  abruption and recommended emergency cesarean delivery. The patient verbally consented, Code cesarean called and we proceeded to the OR.   #GBS  status:  negative #Feeding: Breastmilk  #Reproductive Life planning: Undecided #Circ:  not applicable  Vina Solian, MD  01/15/2024, 11:32 PM

## 2024-01-15 NOTE — MAU Note (Addendum)
 CODE Cesarean called for fetal tracing and 7 min prolonged late.

## 2024-01-15 NOTE — Anesthesia Preprocedure Evaluation (Signed)
 Anesthesia Evaluation  Patient identified by MRN, date of birth, ID band Patient awake    Reviewed: Allergy & PrecautionsPreop documentation limited or incomplete due to emergent nature of procedure.  History of Anesthesia Complications Negative for: history of anesthetic complications  Airway Mallampati: III       Dental   Pulmonary neg pulmonary ROS   Pulmonary exam normal breath sounds clear to auscultation       Cardiovascular negative cardio ROS  Rhythm:Regular Rate:Normal     Neuro/Psych    GI/Hepatic ,GERD  Medicated,,  Endo/Other    Renal/GU      Musculoskeletal   Abdominal   Peds  Hematology  (+) Blood dyscrasia (alpha thalassemia silent carrier), anemia Lab Results      Component                Value               Date                      WBC                      9.9                 01/15/2024                HGB                      10.4 (L)            01/15/2024                HCT                      33.4 (L)            01/15/2024                MCV                      90.8                01/15/2024                PLT                      334                 01/15/2024              Anesthesia Other Findings   Reproductive/Obstetrics (+) Pregnancy                              Anesthesia Physical Anesthesia Plan  ASA: 2 and emergent  Anesthesia Plan: General   Post-op Pain Management:    Induction: Intravenous  PONV Risk Score and Plan: 3 and Ondansetron , Dexamethasone  and Treatment may vary due to age or medical condition  Airway Management Planned: Oral ETT and Video Laryngoscope Planned  Additional Equipment:   Intra-op Plan:   Post-operative Plan: Extubation in OR  Informed Consent: I have reviewed the patients History and Physical, chart, labs and discussed the procedure including the risks, benefits and alternatives for the proposed anesthesia with the  patient or authorized representative who has indicated his/her understanding and acceptance.     Dental advisory given  Plan Discussed with: CRNA and Anesthesiologist  Anesthesia Plan Comments: (Patient rolled into OR emergently directly from MAU. Quick review of pertinent history, NPO time, and anesthesia history. Quick discussion of GETA.)        Anesthesia Quick Evaluation

## 2024-01-15 NOTE — Anesthesia Procedure Notes (Signed)
 Procedure Name: Intubation Date/Time: 01/15/2024 10:26 PM  Performed by: Mylo Asberry BIRCH, CRNAPre-anesthesia Checklist: Patient identified, Emergency Drugs available, Suction available and Patient being monitored Patient Re-evaluated:Patient Re-evaluated prior to induction Oxygen Delivery Method: Circle system utilized Preoxygenation: Pre-oxygenation with 100% oxygen Induction Type: IV induction, Rapid sequence and Cricoid Pressure applied Laryngoscope Size: Glidescope and 3 Grade View: Grade I Tube type: Oral Tube size: 7.0 mm Number of attempts: 1 Airway Equipment and Method: Stylet, Oral airway and Video-laryngoscopy Placement Confirmation: ETT inserted through vocal cords under direct vision, positive ETCO2 and breath sounds checked- equal and bilateral Secured at: 21 cm Tube secured with: Tape Dental Injury: Teeth and Oropharynx as per pre-operative assessment

## 2024-01-15 NOTE — MAU Note (Addendum)
 Sonya Stevens is a 23 y.o. at [redacted]w[redacted]d here in MAU reporting: contractions 1-2 minutes apart that started around 8pm. Denies LOF. Mucus/bloody show present. Positive FM but not since 8pm.  LMP: 04/02/2023 Onset of complaint: 8pm on 01/15/24 Pain score: 10/10 with contractions Vitals:   01/15/24 2125 01/15/24 2126  BP:  111/68  Pulse:  95  Resp:  18  Temp:  98.3 F (36.8 C)  SpO2: 100%   FHT: 170bpm Lab orders placed from triage: Geneticist, molecular

## 2024-01-15 NOTE — Op Note (Signed)
 Sonya Stevens PROCEDURE DATE: 01/15/2024  PREOPERATIVE DIAGNOSES: Intrauterine pregnancy at [redacted]w[redacted]d weeks gestation; abruptio placenta and non-reassuring fetal status  POSTOPERATIVE DIAGNOSES: The same  PROCEDURE: Primary Low Transverse Cesarean Section  SURGEON:  Dr. Vina Solian  ASSISTANT:  None  ANESTHESIOLOGY TEAM: Anesthesiologist: Peggye Delon Brunswick, MD CRNA: Mylo Asberry BIRCH, CRNA; Sterling, Erna HERO, CRNA  INDICATIONS: Sonya Stevens is a 23 y.o. [phone] at [redacted]w[redacted]d here for cesarean section secondary to the indications listed under preoperative diagnoses; please see preoperative note for further details.  The risks of surgery were discussed with the patient including but were not limited to: bleeding which may require transfusion or reoperation; infection which may require antibiotics; injury to bowel, bladder, ureters or other surrounding organs; injury to the fetus; need for additional procedures including hysterectomy in the event of a life-threatening hemorrhage; formation of adhesions; placental abnormalities wth subsequent pregnancies; incisional problems; thromboembolic phenomenon and other postoperative/anesthesia complications.  The patient concurred with the proposed plan, giving informed written consent for the procedure.    FINDINGS:  Viable female infant in cephalic presentation.  Apgars 7 and 8.  Amniotic fluid: clear.  Intact placenta, three vessel cord with velamentous cord insertion with placenta with minimal attachment to the uterus when I went to remove it concerning for abruption.  Normal uterus, fallopian tubes and ovaries bilaterally. She had filmy adhesions to her upper uterus to her anterior abdominal wall such that I did not exteriorize the uterus.   ANESTHESIA: general INTRAVENOUS FLUIDS: 1300 ml   ESTIMATED BLOOD LOSS: 380 ml URINE OUTPUT:  75 ml SPECIMENS: Placenta sent to pathology; Cord gases collected and sent COMPLICATIONS:  None immediate  PROCEDURE IN DETAIL:  The patient preoperatively received intravenous antibiotics and had sequential compression devices applied to her lower extremities.  She was then taken to the operating room where general anesthesia was administered. Prior to this she was placed in a dorsal supine position with a leftward tilt, and prepped and draped in a sterile manner.  A foley catheter was  placed into her bladder and attached to constant gravity.  Once intubation achieved, a Pfannenstiel skin incision was made with scalpel and carried through to the underlying layer of fascia. The fascia was incised in the midline, and this incision was extended bluntly. The rectus muscles were separated in the midline and the peritoneum was entered bluntly.   The bladder blade and Rich retractor were used to visualize the uterus.  Attention was turned to the lower uterine segment where a low transverse hysterotomy was made with a scalpel and extended bilaterally bluntly.  The infant was successfully delivered, the cord was clamped and cut, and the infant was handed over to the awaiting neonatology team. Uterine massage was then administered, and the placenta delivered intact with a three-vessel cord. The uterus was then cleared of clots and debris.  The hysterotomy was closed with 0 Vicryl in a running fashion.  One additional Figure-of-eight 0 Vicryl serosal stitches were placed to help with hemostasis.    The pelvis was cleared of all clot and debris. Hemostasis was confirmed on all surfaces.  The retractors were removed.  The peritoneum was closed with a 2-0 Vicryl running stitch. The fascia was then closed using 0 Vicryl in a running fashion.  The subcutaneous layer was irrigated, was reapproximated with 2-0 plain gut in a running fashion. The skin was closed with a 4-0 monocryl subcuticular stitch. The patient tolerated the procedure well. Sponge, instrument and needle counts were  correct x 3.  She was taken  to the recovery room in stable condition.   Vina Solian, MD Attending Obstetrician & Gynecologist, [facility] for [facility], [facility]

## 2024-01-15 NOTE — Discharge Summary (Signed)
 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 yo [id number] at [redacted]w[redacted]d was admitted in Latent Labor on 01/15/2024. Patient had a labor course significant for prolonged decel and symptoms concerning for conceal placental abruption. The patient went for cesarean section due to Non-Reassuring FHR. Delivery details as follows: Membrane Rupture Time/Date: 10:27 PM,01/15/2024  Delivery Method:C-Section, Low Transverse Operative Delivery:N/A Details of operation can be found in separate operative note. Patient had a postpartum course complicated by***.  She is ambulating,tolerating a regular diet, passing flatus, and urinating well.  Patient is discharged home in stable condition 01/15/24.  Newborn Data: Birth date:01/15/2024 Birth time:10:27 PM Gender:Female Living status:Living Apgars:7 ,8  Weight:2600 g  Magnesium Sulfate received: No BMZ received: No Rhophylac:N/A MMR:N/A T-DaP:Given prenatally Flu: Yes RSV Vaccine received: No Transfusion:{Transfusion received:30440034}  Immunizations received: Immunization History  Administered Date(s) Administered   Influenza,  Seasonal, Injecte, Preservative Fre 09/01/2023   Tdap 11/15/2023    Physical exam  Vitals:   01/15/24 2125 01/15/24 2126  BP:  111/68  Pulse:  95  Resp:  18  Temp:  98.3 F (36.8 C)  TempSrc:  Oral  SpO2: 100%   Weight:  63.7 kg  Height:  5' 2 (1.575 m)   General: {Exam; general:21111117} Lochia: {Desc; appropriate/inappropriate:30686::appropriate} Uterine Fundus: {Desc; firm/soft:30687} Incision: {Exam; incision:21111123} DVT Evaluation: {Exam; icu:7888877} Labs: Lab Results  Component Value Date   WBC 9.9 01/15/2024   HGB 10.4 (L) 01/15/2024   HCT 33.4 (L) 01/15/2024   MCV 90.8 01/15/2024   PLT 334 01/15/2024      Latest Ref Rng & Units 09/20/2023    1:14 AM  CMP  Glucose 70 - 99 mg/dL 99   BUN 6 - 20 mg/dL 7   Creatinine 9.55 - 8.99 mg/dL 9.30   Sodium 864 - 854 mmol/L 135   Potassium 3.5 - 5.1 mmol/L 3.2   Chloride 98 - 111 mmol/L 101   CO2 22 - 32 mmol/L 20   Calcium 8.9 - 10.3 mg/dL 9.6   Total Protein 6.5 - 8.1 g/dL 7.8   Total Bilirubin 0.0 - 1.2 mg/dL 0.4   Alkaline Phos 38 - 126 U/L 96   AST 15 - 41 U/L 22   ALT 0 - 44 U/L 19    Edinburgh Score:     No data to display         No data recorded  After visit meds:  Allergies as of 01/15/2024   No Known Allergies   Med Rec must be completed prior to using this Navos***  Discharge home in stable condition Infant Feeding: Breast Infant Disposition:{CHL IP OB HOME WITH FNUYZM:76418} Discharge instruction: per After Visit Summary and Postpartum booklet. Activity: Advance as tolerated. Pelvic rest for 6 weeks.  Diet: routine diet Future Appointments: Future Appointments  Date Time Provider Department Center  01/20/2024  2:00 PM [facility] PROVIDER 1 [facility]  01/20/2024  2:15 PM [facility] US3 [facility]  01/21/2024 11:15 AM Rudy Carlin LABOR, MD [facility] None   Follow up Visit:  Follow-up Information     [facility] Follow up in 1  week(s).   Specialty: Obstetrics and Gynecology Why: Incision cehck Contact information: [address] [place]  [address] [phone]               Message sent to [facility] on 9/6 by Dr. Cleatus   Please schedule this patient for a In person postpartum visit in 6 weeks with the following provider: Any provider. Additional Postpartum F/U:Incision check 1 week  Low risk pregnancy complicated by: NA Delivery mode:  C-Section, Low Transverse Anticipated Birth Control:  Unsure   01/15/2024 Vina Cleatus, MD

## 2024-01-15 NOTE — Transfer of Care (Signed)
 Immediate Anesthesia Transfer of Care Note  Patient: Sonya Stevens  Procedure(s) Performed: CESAREAN DELIVERY  Patient Location: PACU  Anesthesia Type:General  Level of Consciousness: awake and alert   Airway & Oxygen Therapy: Patient Spontanous Breathing  Post-op Assessment: Report given to RN and Post -op Vital signs reviewed and stable  Post vital signs: Reviewed and stable  Last Vitals:  Vitals Value Taken Time  BP    Temp    Pulse 119 01/15/24 23:11  Resp 21 01/15/24 23:11  SpO2 100 % 01/15/24 23:11  Vitals shown include unfiled device data.  Last Pain:  Vitals:   01/15/24 2126  TempSrc: Oral  PainSc:          Complications: No notable events documented.

## 2024-01-15 NOTE — Anesthesia Postprocedure Evaluation (Signed)
 Anesthesia Post Note  Patient: [name]  Procedure(s) Performed: CESAREAN DELIVERY     Patient location during evaluation: PACU Anesthesia Type: General Level of consciousness: awake Pain management: pain level controlled Vital Signs Assessment: post-procedure vital signs reviewed and stable Respiratory status: spontaneous breathing, nonlabored ventilation and respiratory function stable Cardiovascular status: blood pressure returned to baseline and stable Postop Assessment: no apparent nausea or vomiting Anesthetic complications: no   No notable events documented.  Last Vitals:  Vitals:   01/15/24 2346 01/15/24 2347  BP: 118/72   Pulse: 94 (!) 101  Resp: 13 14  Temp:  36.4 C  SpO2: 100% 100%    Last Pain:  Vitals:   01/15/24 2347  TempSrc: Oral  PainSc:    Pain Goal:    LLE Motor Response: Purposeful movement (01/15/24 2310) LLE Sensation: Full sensation (01/15/24 2310) RLE Motor Response: Purposeful movement (01/15/24 2310) RLE Sensation: Full sensation (01/15/24 2310)     Epidural/Spinal Function Cutaneous sensation: Normal sensation (01/15/24 2310), Patient able to flex knees: Yes (01/15/24 2310), Patient able to lift hips off bed: Yes (01/15/24 2310), Back pain beyond tenderness at insertion site: No (01/15/24 2310), Progressively worsening motor and/or sensory loss: No (01/15/24 2310), Bowel and/or bladder incontinence post epidural: No (01/15/24 2310)  Delon Aisha Arch

## 2024-01-16 ENCOUNTER — Other Ambulatory Visit: Payer: Self-pay

## 2024-01-16 DIAGNOSIS — O368390 Maternal care for abnormalities of the fetal heart rate or rhythm, unspecified trimester, not applicable or unspecified: Secondary | ICD-10-CM | POA: Diagnosis present

## 2024-01-16 LAB — CBC
HCT: 30.3 % — ABNORMAL LOW (ref 36.0–46.0)
HCT: 29.5 % — ABNORMAL LOW (ref 36.0–46.0)
Hemoglobin: 9.4 g/dL — ABNORMAL LOW (ref 12.0–15.0)
Hemoglobin: 9.5 g/dL — ABNORMAL LOW (ref 12.0–15.0)
MCH: 28.7 pg (ref 26.0–34.0)
MCH: 29 pg (ref 26.0–34.0)
MCHC: 31 g/dL (ref 30.0–36.0)
MCHC: 32.2 g/dL (ref 30.0–36.0)
MCV: 92.4 fL (ref 80.0–100.0)
MCV: 89.9 fL (ref 80.0–100.0)
Platelets: 267 K/uL (ref 150–400)
Platelets: 280 K/uL (ref 150–400)
RBC: 3.28 MIL/uL — ABNORMAL LOW (ref 3.87–5.11)
RBC: 3.28 MIL/uL — ABNORMAL LOW (ref 3.87–5.11)
RDW: 14.3 % (ref 11.5–15.5)
RDW: 14.2 % (ref 11.5–15.5)
WBC: 15.1 K/uL — ABNORMAL HIGH (ref 4.0–10.5)
WBC: 14.7 K/uL — ABNORMAL HIGH (ref 4.0–10.5)
nRBC: 0 % (ref 0.0–0.2)
nRBC: 0 % (ref 0.0–0.2)

## 2024-01-16 LAB — CREATININE, SERUM
Creatinine, Ser: 0.78 mg/dL (ref 0.44–1.00)
GFR, Estimated: >60 mL/min (ref >60)

## 2024-01-16 LAB — RPR: RPR Ser Ql: NONREACTIVE

## 2024-01-16 MED ORDER — ENOXAPARIN SODIUM 40 MG/0.4ML IJ SOSY
40.0000 mg | PREFILLED_SYRINGE | INTRAMUSCULAR | Status: DC
  Administered 2024-01-16 – 2024-01-18 (×2): 40 mg via SUBCUTANEOUS
  Filled 2024-01-16 (×3): qty 0.4

## 2024-01-16 MED ORDER — DIBUCAINE (PERIANAL) 1 % EX OINT: 1.0000 | TOPICAL_OINTMENT | CUTANEOUS | Status: DC | PRN

## 2024-01-16 MED ORDER — KETOROLAC TROMETHAMINE 30 MG/ML IJ SOLN
30.0000 mg | Freq: Four times a day (QID) | INTRAMUSCULAR | Status: AC
Start: 2024-01-16 — End: 2024-01-17
  Administered 2024-01-16: 30 mg via INTRAVENOUS
  Filled 2024-01-16: qty 1

## 2024-01-16 MED ORDER — TETANUS-DIPHTH-ACELL PERTUSSIS 5-2.5-18.5 LF-MCG/0.5 IM SUSY: 0.5000 mL | PREFILLED_SYRINGE | Freq: Once | INTRAMUSCULAR | Status: DC

## 2024-01-16 MED ORDER — IBUPROFEN 600 MG PO TABS
600.0000 mg | ORAL_TABLET | Freq: Four times a day (QID) | ORAL | Status: DC
Start: 2024-01-17 — End: 2024-01-18
  Administered 2024-01-16 – 2024-01-18 (×7): 600 mg via ORAL
  Filled 2024-01-16 (×9): qty 1

## 2024-01-16 MED ORDER — COCONUT OIL OIL: 1.0000 | TOPICAL_OIL | Status: DC | PRN

## 2024-01-16 MED ORDER — SIMETHICONE 80 MG PO CHEW
80.0000 mg | CHEWABLE_TABLET | Freq: Three times a day (TID) | ORAL | Status: DC
  Administered 2024-01-16 – 2024-01-18 (×5): 80 mg via ORAL
  Filled 2024-01-16 (×6): qty 1

## 2024-01-16 MED ORDER — SENNOSIDES-DOCUSATE SODIUM 8.6-50 MG PO TABS
2.0000 | ORAL_TABLET | Freq: Every day | ORAL | Status: DC
  Administered 2024-01-16 – 2024-01-18 (×3): 2 via ORAL
  Filled 2024-01-16 (×3): qty 2

## 2024-01-16 MED ORDER — GABAPENTIN 100 MG PO CAPS
100.0000 mg | ORAL_CAPSULE | Freq: Two times a day (BID) | ORAL | Status: DC
  Administered 2024-01-16 – 2024-01-18 (×6): 100 mg via ORAL
  Filled 2024-01-16 (×6): qty 1

## 2024-01-16 MED ORDER — MENTHOL 3 MG MT LOZG
1.0000 | LOZENGE | OROMUCOSAL | Status: DC | PRN
  Administered 2024-01-16 – 2024-01-17 (×2): 3 mg via ORAL
  Filled 2024-01-16: qty 9

## 2024-01-16 MED ORDER — HYDROMORPHONE HCL 1 MG/ML IJ SOLN: 0.2000 mg | INTRAMUSCULAR | Status: DC | PRN

## 2024-01-16 MED ORDER — PRENATAL MULTIVITAMIN CH
1.0000 | ORAL_TABLET | Freq: Every day | ORAL | Status: DC
  Administered 2024-01-16 – 2024-01-17 (×2): 1 via ORAL
  Filled 2024-01-16 (×2): qty 1

## 2024-01-16 MED ORDER — ZOLPIDEM TARTRATE 5 MG PO TABS: 5.0000 mg | ORAL_TABLET | Freq: Every evening | ORAL | Status: DC | PRN

## 2024-01-16 MED ORDER — WITCH HAZEL-GLYCERIN EX PADS: 1.0000 | MEDICATED_PAD | CUTANEOUS | Status: DC | PRN

## 2024-01-16 MED ORDER — SIMETHICONE 80 MG PO CHEW
80.0000 mg | CHEWABLE_TABLET | ORAL | Status: DC | PRN
  Administered 2024-01-17: 80 mg via ORAL
  Filled 2024-01-16: qty 1

## 2024-01-16 MED ORDER — ACETAMINOPHEN 500 MG PO TABS
1000.0000 mg | ORAL_TABLET | Freq: Four times a day (QID) | ORAL | Status: DC
  Administered 2024-01-16 – 2024-01-18 (×8): 1000 mg via ORAL
  Filled 2024-01-16 (×8): qty 2

## 2024-01-16 MED ORDER — OXYTOCIN-SODIUM CHLORIDE 30-0.9 UT/500ML-% IV SOLN: 2.5000 [IU]/h | INTRAVENOUS | Status: AC

## 2024-01-16 MED ORDER — FERROUS SULFATE 325 (65 FE) MG PO TABS
325.0000 mg | ORAL_TABLET | ORAL | Status: DC
  Administered 2024-01-16 – 2024-01-18 (×2): 325 mg via ORAL
  Filled 2024-01-16 (×2): qty 1

## 2024-01-16 MED ORDER — DIPHENHYDRAMINE HCL 25 MG PO CAPS: 25.0000 mg | ORAL_CAPSULE | Freq: Four times a day (QID) | ORAL | Status: DC | PRN

## 2024-01-16 MED ORDER — OXYCODONE HCL 5 MG PO TABS
5.0000 mg | ORAL_TABLET | ORAL | Status: DC | PRN
  Administered 2024-01-17: 5 mg via ORAL
  Filled 2024-01-16: qty 1

## 2024-01-16 NOTE — Lactation Note (Signed)
 This note was copied from a baby's chart. Lactation Consultation Note  Patient Name: Sonya Stevens Unijb'd Date: 01/16/2024 Age:24 hours Reason for consult: Initial assessment;NICU baby;Early term 37-38.6wks  C/O: ETI in NICU  LC assisted with flange fitting [name], [name] given 18 mm breast flange and was pumping with DEBP when LC left the room. [name] will follow NICU feeding guidelines, [name] will continue to use the DEBP every 3 hours for 15 minutes on initial setting. [name] has personal band own that [facility] assisted with making into a personal pumping bra. LC discussed the importance of maternal rest, meals and hydration. LC discussed breast milk storage and collection handout that was given to [name]. [name] has set timer on her phone to pump every 3 hours. [name] knows to call if she has any breastfeeding questions or concerns. [name] was  made aware of O/P services, breastfeeding support groups, community resources, and our phone # for post-discharge questions.    Maternal Data Has patient been taught Hand Expression?: Yes Does the patient have breastfeeding experience prior to this delivery?: Yes How long did the patient breastfeed?: Per [name] she breastfeed her 1st child for one year.  Feeding Mother's Current Feeding Choice: Breast Milk and Formula  LATCH Score                    Lactation Tools Discussed/Used Tools: Pump;Flanges Flange Size: 18 Breast pump type: Double-Electric Breast Pump Pump Education: Setup, frequency, and cleaning;Milk Storage Reason for Pumping: Infant in NICU Pumping frequency: [name] will continue to pump ever 3 hours for 15 minutes.  Interventions Interventions: Breast feeding basics reviewed;Hand express;Hand pump;DEBP;Education;Guidelines for Milk Supply and Pumping Schedule Handout;LC Services brochure;CDC milk storage guidelines;CDC Guidelines for Breast Pump Cleaning  Discharge Pump: DEBP;Manual;Personal  Consult Status Consult Status: NICU  follow-up Date: 01/17/24 Follow-up type: In-patient    Grayce LULLA Batter 01/16/2024, 12:15 PM

## 2024-01-16 NOTE — Progress Notes (Signed)
 POD1 s/p 1LTCS  Subjective: no complaints and tolerating PO  Objective: Blood pressure 103/67, pulse 79, temperature 98 F (36.7 C), temperature source Oral, resp. rate 17, height 5' 2 (1.575 m), weight 103.4 kg, last menstrual period 04/02/2023, SpO2 100%, unknown if currently breastfeeding.  Physical Exam:  General: alert, cooperative, and no distress Lochia: appropriate Uterine Fundus: firm Incision: healing well DVT Evaluation: No evidence of DVT seen on physical exam.  Recent Labs    01/16/24 0141 01/16/24 0420  HGB 9.4* 9.5*  HCT 30.3* 29.5*    Assessment/Plan: Postpartum - Contraception: Undecided  - MOF: Breast - Rh status: Positive - Rubella status: Immune - Dispo: POD2-3 - Consults: Lactation  Neonatal - Doing well in NICU per pt. She would like to be able to go see baby.    LOS: 1 day   Vina Solian 01/16/2024, 8:17 AM

## 2024-01-16 NOTE — Plan of Care (Signed)
  Problem: Education: Goal: Knowledge of Childbirth will improve Outcome: Progressing Goal: Ability to make informed decisions regarding treatment and plan of care will improve Outcome: Progressing Goal: Ability to state and carry out methods to decrease the pain will improve Outcome: Progressing Goal: Individualized Educational Video(s) Outcome: Progressing   Problem: Coping: Goal: Ability to verbalize concerns and feelings about labor and delivery will improve Outcome: Progressing   Problem: Life Cycle: Goal: Ability to make normal progression through stages of labor will improve Outcome: Progressing Goal: Ability to effectively push during vaginal delivery will improve Outcome: Progressing   Problem: Role Relationship: Goal: Will demonstrate positive interactions with the child Outcome: Progressing   Problem: Safety: Goal: Risk of complications during labor and delivery will decrease Outcome: Progressing   Problem: Pain Management: Goal: Relief or control of pain from uterine contractions will improve Outcome: Progressing   Problem: Education: Goal: Knowledge of General Education information will improve Description: Including pain rating scale, medication(s)/side effects and non-pharmacologic comfort measures Outcome: Progressing   Problem: Health Behavior/Discharge Planning: Goal: Ability to manage health-related needs will improve Outcome: Progressing   Problem: Clinical Measurements: Goal: Ability to maintain clinical measurements within normal limits will improve Outcome: Progressing Goal: Will remain free from infection Outcome: Progressing Goal: Diagnostic test results will improve Outcome: Progressing Goal: Respiratory complications will improve Outcome: Progressing Goal: Cardiovascular complication will be avoided Outcome: Progressing   Problem: Activity: Goal: Risk for activity intolerance will decrease Outcome: Progressing   Problem:  Nutrition: Goal: Adequate nutrition will be maintained Outcome: Progressing   Problem: Coping: Goal: Level of anxiety will decrease Outcome: Progressing   Problem: Elimination: Goal: Will not experience complications related to bowel motility Outcome: Progressing Goal: Will not experience complications related to urinary retention Outcome: Progressing   Problem: Pain Managment: Goal: General experience of comfort will improve and/or be controlled Outcome: Progressing   Problem: Safety: Goal: Ability to remain free from injury will improve Outcome: Progressing   Problem: Skin Integrity: Goal: Risk for impaired skin integrity will decrease Outcome: Progressing   Problem: Education: Goal: Knowledge of the prescribed therapeutic regimen will improve Outcome: Progressing Goal: Understanding of sexual limitations or changes related to disease process or condition will improve Outcome: Progressing Goal: Individualized Educational Video(s) Outcome: Progressing   Problem: Self-Concept: Goal: Communication of feelings regarding changes in body function or appearance will improve Outcome: Progressing   Problem: Skin Integrity: Goal: Demonstration of wound healing without infection will improve Outcome: Progressing   Problem: Education: Goal: Knowledge of condition will improve Outcome: Progressing Goal: Individualized Educational Video(s) Outcome: Progressing Goal: Individualized Newborn Educational Video(s) Outcome: Progressing   Problem: Activity: Goal: Will verbalize the importance of balancing activity with adequate rest periods Outcome: Progressing Goal: Ability to tolerate increased activity will improve Outcome: Progressing   Problem: Coping: Goal: Ability to identify and utilize available resources and services will improve Outcome: Progressing   Problem: Life Cycle: Goal: Chance of risk for complications during the postpartum period will decrease Outcome:  Progressing   Problem: Role Relationship: Goal: Ability to demonstrate positive interaction with newborn will improve Outcome: Progressing   Problem: Skin Integrity: Goal: Demonstration of wound healing without infection will improve Outcome: Progressing

## 2024-01-17 ENCOUNTER — Encounter (HOSPITAL_COMMUNITY): Payer: Self-pay | Admitting: Obstetrics and Gynecology

## 2024-01-17 MED ORDER — ACETAMINOPHEN 500 MG PO TABS
ORAL_TABLET | ORAL | Status: AC
  Filled 2024-01-17: qty 1

## 2024-01-17 MED ORDER — BISACODYL 10 MG RE SUPP: 10.0000 mg | Freq: Every day | RECTAL | Status: DC | PRN

## 2024-01-17 NOTE — Progress Notes (Signed)
 Subjective: Postpartum Day 2: Cesarean Delivery Patient reports incisional pain, tolerating PO, + flatus, and no problems voiding.    Objective: Vital signs in last 24 hours: Temp:  [97.7 F (36.5 C)-98.1 F (36.7 C)] 97.7 F (36.5 C) (09/07 2004) Pulse Rate:  [70-78] 76 (09/07 2004) Resp:  [16-17] 16 (09/07 2004) BP: (97-113)/(59-68) 103/65 (09/07 2004) SpO2:  [99 %-100 %] 99 % (09/07 2004)  Physical Exam:  General: alert, cooperative, and no distress Lochia: appropriate Uterine Fundus: firm Incision: healing well, no significant drainage, no dehiscence, no significant erythema DVT Evaluation: No evidence of DVT seen on physical exam. joyice, bloated encouraged to walk hot liquids  Recent Labs    01/16/24 0141 01/16/24 0420  HGB 9.4* 9.5*  HCT 30.3* 29.5*    Assessment/Plan: Status post Cesarean section. Doing well postoperatively.  Anticipate discharge tomorrow.  Sonya VEAR Inch, MD 01/17/2024, 7:49 AM

## 2024-01-17 NOTE — Lactation Note (Signed)
 This note was copied from a baby's chart.  NICU Lactation Consultation Note  Patient Name: Sonya Stevens Unijb'd Date: 01/17/2024 Age:24 hours  Reason for consult: Follow-up assessment; NICU baby; Mother's request; RN request; Breastfeeding assistance; Infant < 6lbs; Early term 96-38.6wks  SUBJECTIVE Visited with family of 39 83/19 weeks old NICU female; baby Marcelina got admitted due to respiratory distress. RN Angelia called out for lactation to assist with the 12 pm feeding, Ms. Mungin has already taken baby to breast and voiced she has been able to latch on. Baby asleep the first time this LC entered the room but the second time, she woke up crying and self latched in cross cradle hold to the R side (due to IV placement) and suckled vigorously for 3 minutes prior to falling asleep. She woke up again and did another 7 minutes on the same side, this time required assistance to latch, she did with a wide open mouth, showed Ms. Sotero how to hold the breast and doing compressions with the opposite hand while gavage tube was running. Total feeding time was 10 minutes were NNS was the predominant sucking pattern at 38 hours post-partum. Ms. Zuccaro reported she's been pumping consistently and got enough colostrum to collect, praised her for all her efforts. She had questions about how to increase with supply, discussed hydration and a healthy diet. She was a NICU 28 weeks infant herself and had to have an ileostomy at 2 weeks. She was wondering how baby will do with her own breastmilk, since there is a history of milk allergy in the family. Will continue to monitor intake of maternal/donor breastmilk.   OBJECTIVE Infant data: Mother's Current Feeding Choice: Breast Milk and Donor Milk  O2 Device: Room Air FiO2 (%): 21 %  Infant feeding assessment IDFTS - Readiness: 2 IDFTS - Quality: 2   Maternal data: G2P2002 C-Section, Low Transverse Has patient been taught Hand Expression?:  Yes Hand Expression Comments: LC used  breast model to teach hand expression, colostrum not present. Significant Breast History:: (++) breast changes during the pregnancy Current breast feeding challenges:: NICU admission Does the patient have breastfeeding experience prior to this delivery?: Yes How long did the patient breastfeed?: 1 month (on 01/17/2024) Pumping frequency: 6-7 times/24 hours Pumped volume: 1 mL (1-2 ml) Flange Size: 18 Risk factor for low/delayed milk supply:: infant separation  Pump: Hands Free, Personal (Motif wearable through insurance)  ASSESSMENT Infant: Latch: Repeated attempts needed to sustain latch, nipple held in mouth throughout feeding, stimulation needed to elicit sucking reflex. Audible Swallowing: None Type of Nipple: Everted at rest and after stimulation Comfort (Breast/Nipple): Soft / non-tender Hold (Positioning): Assistance needed to correctly position infant at breast and maintain latch. LATCH Score: 6  Feeding Status: Scheduled 9-12-3-6 Feeding method: Breast Nipple Type: Slow - flow  Maternal: Milk volume: Normal  INTERVENTIONS/PLAN Interventions: Interventions: Breast feeding basics reviewed; Assisted with latch; Skin to skin; Breast massage; Hand express; Breast compression; Adjust position; Support pillows; DEBP; Education; NICU Pumping Log Tools: Pump; Flanges Pump Education: Setup, frequency, and cleaning; Milk Storage  Plan: STS around care times Breast massage, hand expression prior to latching/pumping Pump both breasts on initiate mode every 3 hours for 15 minutes, ideally 8 pumping sessions/24 hours Offer the breast on feeding cues around feeding times and call for assistance PRN  FOB present. All questions and concerns answered, family to contact [facility] services PRN.  Consult Status: NICU follow-up NICU Follow-up type: Maternal D/C visit   Ola Fawver S  Bassheva Flury 01/17/2024, 12:57 PM

## 2024-01-17 NOTE — Progress Notes (Signed)
Pt to nicu at this time.

## 2024-01-18 ENCOUNTER — Other Ambulatory Visit (HOSPITAL_COMMUNITY): Payer: Self-pay

## 2024-01-18 DIAGNOSIS — D62 Acute posthemorrhagic anemia: Secondary | ICD-10-CM | POA: Insufficient documentation

## 2024-01-18 LAB — SURGICAL PATHOLOGY

## 2024-01-18 MED ORDER — OXYCODONE HCL 5 MG PO TABS
5.0000 mg | ORAL_TABLET | ORAL | 0 refills | Status: AC | PRN
  Filled 2024-01-18: qty 30, 5d supply, fill #0

## 2024-01-18 MED ORDER — FERROUS SULFATE 325 (65 FE) MG PO TABS
325.0000 mg | ORAL_TABLET | ORAL | 3 refills | Status: AC
  Filled 2024-01-18: qty 30, 60d supply, fill #0

## 2024-01-18 MED ORDER — MENTHOL 3 MG MT LOZG
1.0000 | LOZENGE | OROMUCOSAL | 0 refills | Status: AC | PRN
  Filled 2024-01-18: qty 20, 2d supply, fill #0

## 2024-01-18 MED ORDER — SENNOSIDES-DOCUSATE SODIUM 8.6-50 MG PO TABS
2.0000 | ORAL_TABLET | Freq: Every evening | ORAL | 2 refills | Status: AC | PRN
  Filled 2024-01-18: qty 30, 15d supply, fill #0

## 2024-01-18 MED ORDER — IBUPROFEN 600 MG PO TABS
600.0000 mg | ORAL_TABLET | Freq: Four times a day (QID) | ORAL | 2 refills | Status: AC | PRN
  Filled 2024-01-18: qty 60, 15d supply, fill #0

## 2024-01-18 MED ORDER — ACETAMINOPHEN 500 MG PO TABS
1000.0000 mg | ORAL_TABLET | Freq: Four times a day (QID) | ORAL | 2 refills | Status: AC | PRN
  Filled 2024-01-18: qty 100, 13d supply, fill #0

## 2024-01-18 NOTE — Lactation Note (Signed)
 This note was copied from a baby's chart.  NICU Lactation Consultation Note  Patient Name: Sonya Stevens Unijb'd Date: 01/18/2024 Age:24 hours  Reason for consult: Follow-up assessment; NICU baby; Early term 30-38.6wks; Maternal discharge; Infant < 6lbs  SUBJECTIVE Visited with family of 59 59/78 weeks old NICU female Sonya Stevens; [name] is a P2 and reported she's been pumping but it's been challenging to follow the 3-hour schedule. She's been using the DEBP but also the hand pump as she gets more volume with it. She hasn't been taking baby to breast since the last time LC assisted with breastfeeding, parents have been working on bottle feedings; they voiced that baby has been tolerating donor/EBM better than formula. [name] is getting discharged today from [facility]. Reviewed discharge education, pump settings and the importance of consistent pumping for the onset of lactogenesis II and the prevention of engorgement.   OBJECTIVE Infant data: Mother's Current Feeding Choice: Breast Milk and Donor Milk  O2 Device: Room Air  Infant feeding assessment IDFTS - Readiness: 2 IDFTS - Quality: 2   Maternal data: G2P2002 C-Section, Low Transverse Significant Breast History:: (++) breast changes during the pregnancy Current breast feeding challenges:: NICU admission How long did the patient breastfeed?: 1 month (on 01/17/2024) Pumping frequency: 4 times/24 hours Pumped volume: 7 mL (7-8 ml) Flange Size: 18 Hands-free pumping top sizes: Small/Medium (Blue) Risk factor for low/delayed milk supply:: infant separation  Pump: Hands Free, Personal (Motif wearable through insurance)  ASSESSMENT Infant: Feeding Status: Scheduled 9-12-3-6 Feeding method: Bottle Nipple Type: Nfant Extra Slow Flow (gold)  Maternal: Milk volume: Normal  INTERVENTIONS/PLAN Interventions: Interventions: Breast feeding basics reviewed; Coconut oil; DEBP; Education Discharge Education: Engorgement and  breast care Tools: Hands-free pumping top; Pump Pump Education: Setup, frequency, and cleaning; Milk Storage  Plan: STS around care times Pump both breasts on initiate mode every 3 hours for 15 minutes, ideally 8 pumping sessions/24 hours Switch to maintain mode once expressing +20 ml of EBM combined Bring pump pieces to baby's room after her discharge Offer the breast on feeding cues around feeding times and call for assistance PRN   FOB and 2 y.o big brother present. All questions and concerns answered, family to contact [facility] services PRN.   Consult Status: NICU follow-up NICU Follow-up type: Verify onset of copious milk; Verify absence of engorgement   [name] S [name] 01/18/2024, 11:05 AM

## 2024-01-18 NOTE — Plan of Care (Signed)
 Problem: Education: Goal: Knowledge of Childbirth will improve Outcome: Adequate for Discharge Goal: Ability to make informed decisions regarding treatment and plan of care will improve Outcome: Adequate for Discharge Goal: Ability to state and carry out methods to decrease the pain will improve Outcome: Adequate for Discharge Goal: Individualized Educational Video(s) Outcome: Adequate for Discharge   Problem: Coping: Goal: Ability to verbalize concerns and feelings about labor and delivery will improve Outcome: Adequate for Discharge   Problem: Life Cycle: Goal: Ability to make normal progression through stages of labor will improve Outcome: Adequate for Discharge Goal: Ability to effectively push during vaginal delivery will improve Outcome: Adequate for Discharge   Problem: Role Relationship: Goal: Will demonstrate positive interactions with the child Outcome: Adequate for Discharge   Problem: Safety: Goal: Risk of complications during labor and delivery will decrease Outcome: Adequate for Discharge   Problem: Pain Management: Goal: Relief or control of pain from uterine contractions will improve Outcome: Adequate for Discharge   Problem: Education: Goal: Knowledge of General Education information will improve Description: Including pain rating scale, medication(s)/side effects and non-pharmacologic comfort measures Outcome: Adequate for Discharge   Problem: Health Behavior/Discharge Planning: Goal: Ability to manage health-related needs will improve Outcome: Adequate for Discharge   Problem: Clinical Measurements: Goal: Ability to maintain clinical measurements within normal limits will improve Outcome: Adequate for Discharge Goal: Will remain free from infection Outcome: Adequate for Discharge Goal: Diagnostic test results will improve Outcome: Adequate for Discharge Goal: Respiratory complications will improve Outcome: Adequate for Discharge Goal:  Cardiovascular complication will be avoided Outcome: Adequate for Discharge   Problem: Activity: Goal: Risk for activity intolerance will decrease Outcome: Adequate for Discharge   Problem: Nutrition: Goal: Adequate nutrition will be maintained Outcome: Adequate for Discharge   Problem: Coping: Goal: Level of anxiety will decrease Outcome: Adequate for Discharge   Problem: Elimination: Goal: Will not experience complications related to bowel motility Outcome: Adequate for Discharge Goal: Will not experience complications related to urinary retention Outcome: Adequate for Discharge   Problem: Pain Managment: Goal: General experience of comfort will improve and/or be controlled Outcome: Adequate for Discharge   Problem: Safety: Goal: Ability to remain free from injury will improve Outcome: Adequate for Discharge   Problem: Skin Integrity: Goal: Risk for impaired skin integrity will decrease Outcome: Adequate for Discharge   Problem: Education: Goal: Knowledge of the prescribed therapeutic regimen will improve Outcome: Adequate for Discharge Goal: Understanding of sexual limitations or changes related to disease process or condition will improve Outcome: Adequate for Discharge Goal: Individualized Educational Video(s) Outcome: Adequate for Discharge   Problem: Self-Concept: Goal: Communication of feelings regarding changes in body function or appearance will improve Outcome: Adequate for Discharge   Problem: Skin Integrity: Goal: Demonstration of wound healing without infection will improve Outcome: Adequate for Discharge   Problem: Education: Goal: Knowledge of condition will improve Outcome: Adequate for Discharge Goal: Individualized Educational Video(s) Outcome: Adequate for Discharge Goal: Individualized Newborn Educational Video(s) Outcome: Adequate for Discharge   Problem: Activity: Goal: Will verbalize the importance of balancing activity with adequate  rest periods Outcome: Adequate for Discharge Goal: Ability to tolerate increased activity will improve Outcome: Adequate for Discharge   Problem: Coping: Goal: Ability to identify and utilize available resources and services will improve Outcome: Adequate for Discharge   Problem: Life Cycle: Goal: Chance of risk for complications during the postpartum period will decrease Outcome: Adequate for Discharge   Problem: Role Relationship: Goal: Ability to demonstrate positive interaction with newborn will  improve Outcome: Adequate for Discharge   Problem: Skin Integrity: Goal: Demonstration of wound healing without infection will improve Outcome: Adequate for Discharge

## 2024-01-20 ENCOUNTER — Ambulatory Visit

## 2024-01-20 DIAGNOSIS — Z3480 Encounter for supervision of other normal pregnancy, unspecified trimester: Secondary | ICD-10-CM

## 2024-01-20 NOTE — Lactation Note (Signed)
 This note was copied from a baby's chart.  NICU Lactation Consultation Note  Patient Name: Sonya Stevens Date: 01/20/2024 Age:24 days  Reason for consult: Follow-up assessment; NICU baby; Early term 14-38.6wks; Infant < 6lbs  SUBJECTIVE  LC in to visit with P2 Mom of baby Sonya Stevens delivered by C/Section.  Mom states she would like to get a hospital grade pump as her pump at home isn't very strong.  Mom states she is using a hand pump and the Luna Motif pump alternately.  Volume is increasing to 90 ml.    Mom currently holding baby swaddled.  Encouraged her to remove swaddle and hold baby STS and pump after.  Pump is not set up in her room, LC offered to set it up and assist her to pump, but Mom declined.  Her toddler and FOB in room, so not sure how long she will be here.  Mom states she has WIC.  Offered her a [employer], Mom states maybe tomorrow.  [facility] referral sent.  Mom is determined to have baby only her breast milk due to family milk allergies FOB concerned that Mom isn't breastfeeding baby.  Mom states she plans to when she gets home.  LC encouraged her to see the OP lactation consultant after baby's discharge.  Mom states her MD is at the [facility].   . OBJECTIVE Infant data: No data recorded O2 Device: Room Air  Infant feeding assessment IDFTS - Readiness: 1 IDFTS - Quality: 2   Maternal data: G2P2002 C-Section, Low Transverse Pumping frequency: 8 times per 24 hrs Pumped volume: 90 mL Flange Size: 18 Hands-free pumping top sizes: Small/Medium (Blue)  WIC Program: Yes WIC Referral Sent?: Yes What county?: [place] Pump: Hands Free, Personal (Motif wearable through insurance)  ASSESSMENT Infant:   Feeding Status: Ad lib Feeding method: Bottle Nipple Type: (S) Dr. Jonna Preemie  Maternal: Milk volume: Normal  INTERVENTIONS/PLAN Interventions: Interventions: Skin to skin; Breast massage; Hand express; DEBP; Education Discharge Education: Outpatient  recommendation Tools: Pump; Flanges; Hands-free pumping top Pump Education: Setup, frequency, and cleaning; Milk Storage Outpatient message sent  Plan: Consult Status: NICU follow-up NICU Follow-up type: Baby's discharge   Claudene Aleck BRAVO 01/20/2024, 5:05 PM

## 2024-01-21 ENCOUNTER — Encounter: Admitting: Obstetrics

## 2024-01-21 NOTE — Lactation Note (Signed)
 This note was copied from a baby's chart.  NICU Lactation Consultation Note  Patient Name: Girl Maryalice Pasley Unijb'd Date: 01/21/2024 Age:24 days  Reason for consult: Follow-up assessment; Early term 39-38.6wks; NICU baby; Infant < 6lbs  SUBJECTIVE  LC in to visit with P2 Mom of ET infant on day of baby's discharge.   Mom hasn't gotten a call from [facility] yet.  Offered a [employer], but [employer] declined as they didn't bring any cash.  Mom is now expressing with Luna Motif and hand pump at each session and expressing 120 ml.   Mom desires OP lactation support, message sent to clinic.  OBJECTIVE Infant data: No data recorded O2 Device: Room Air  Infant feeding assessment IDFTS - Readiness: 2 IDFTS - Quality: 2   Maternal data: G2P2002 C-Section, Low Transverse Pumping frequency: 8 times per 24 hrs Pumped volume: 120 mL Flange Size: 18 Hands-free pumping top sizes: Small/Medium (Blue)  WIC Program: Yes WIC Referral Sent?: Yes What county?: [place] Pump: Manual, Personal, DEBP  ASSESSMENT Infant:  Feeding Status: Ad lib Feeding method: Bottle Nipple Type: Dr. Jonna Preemie  Maternal: Milk volume: Normal  INTERVENTIONS/PLAN Interventions: Interventions: Skin to skin; Breast massage; Hand express; DEBP; Hand pump Discharge Education: Engorgement and breast care; Outpatient recommendation; Outpatient Epic message sent Tools: Pump; Flanges; Hands-free pumping top Pump Education: Setup, frequency, and cleaning; Milk Storage  Plan: Consult Status: Complete NICU Follow-up type: Baby's discharge   Claudene Aleck BRAVO 01/21/2024, 12:50 PM

## 2024-01-24 ENCOUNTER — Ambulatory Visit

## 2024-01-27 ENCOUNTER — Ambulatory Visit (INDEPENDENT_AMBULATORY_CARE_PROVIDER_SITE_OTHER)

## 2024-01-27 DIAGNOSIS — Z4889 Encounter for other specified surgical aftercare: Secondary | ICD-10-CM | POA: Diagnosis not present

## 2024-01-27 NOTE — Progress Notes (Signed)
 Subjective:     Sonya Stevens is a 23 y.o. female who presents to the clinic 2 weeks status post low uterine, transverse cesarean section. Pt reports incision is healing well.      Objective:    BP 104/69   Pulse (!) 102   LMP 04/02/2023 (Approximate)  General:  alert, well appearing, in no apparent distress  Incision:   healing well, no drainage, no erythema, no hernia, no seroma, no swelling, no dehiscence, incision well approximated     Assessment:    Doing well postoperatively.   Plan:    1. Continue any current medications. 2. Wound care discussed. 3. Follow up: PP visit or PRN.   Duwaine LOISE Galla, RN

## 2024-02-17 ENCOUNTER — Ambulatory Visit: Admitting: Obstetrics

## 2024-02-28 ENCOUNTER — Ambulatory Visit (INDEPENDENT_AMBULATORY_CARE_PROVIDER_SITE_OTHER): Admitting: Obstetrics and Gynecology

## 2024-02-28 ENCOUNTER — Encounter: Payer: Self-pay | Admitting: Obstetrics and Gynecology

## 2024-02-28 ENCOUNTER — Other Ambulatory Visit (HOSPITAL_COMMUNITY)
Admission: RE | Admit: 2024-02-28 | Discharge: 2024-02-28 | Disposition: A | Discharge status: Home / Self Care | Source: Ambulatory Visit | Attending: Obstetrics and Gynecology | Admitting: Obstetrics and Gynecology

## 2024-02-28 VITALS — BP 96/66 | HR 76 | Ht 62.0 in | Wt 119.0 lb

## 2024-02-28 DIAGNOSIS — Z124 Encounter for screening for malignant neoplasm of cervix: Secondary | ICD-10-CM

## 2024-02-28 DIAGNOSIS — Z30011 Encounter for initial prescription of contraceptive pills: Secondary | ICD-10-CM

## 2024-02-28 DIAGNOSIS — Z8759 Personal history of other complications of pregnancy, childbirth and the puerperium: Secondary | ICD-10-CM

## 2024-02-28 MED ORDER — SLYND 4 MG PO TABS: 1.0000 | ORAL_TABLET | Freq: Every day | ORAL | 3 refills | Status: AC

## 2024-02-28 NOTE — Progress Notes (Signed)
 Post Partum Visit Note  Sonya Stevens is a 23 y.o. [phone] female who presents for a postpartum visit. She is 6 weeks postpartum following a primary cesarean section.  I have fully reviewed the prenatal and intrapartum course. The delivery was at 37.6 gestational weeks.  Anesthesia: general. Postpartum course has been good, feeling tired due to breastfeeding throughout the night. Baby is doing well following discharge from NICU. Baby is feeding by both breast and bottle - Organic Kindermilk. Bleeding no bleeding. Bowel function is normal. Bladder function is normal. Patient is sexually active. Last intercourse was last night.  Contraception method is oral progesterone -only contraceptive.   Postpartum depression screening: negative, score 0.   Upstream - 02/28/24 1041       Pregnancy Intention Screening   Does the patient want to become pregnant in the next year? No    Does the patient's partner want to become pregnant in the next year? No    Would the patient like to discuss contraceptive options today? Yes      Contraception Wrap Up   Current Method No Method - Other Reason    End Method Oral Contraceptive    How was the end contraceptive method provided? Prescription         The pregnancy intention screening data noted above was reviewed. Potential methods of contraception were discussed. The patient elected to proceed with Oral Contraceptive.   Edinburgh Postnatal Depression Scale - 02/28/24 0936       Edinburgh Postnatal Depression Scale:  In the Past 7 Days   I have been able to laugh and see the funny side of things. 0    I have looked forward with enjoyment to things. 0    I have blamed myself unnecessarily when things went wrong. 0    I have been anxious or worried for no good reason. 0    I have felt scared or panicky for no good reason. 0    Things have been getting on top of me. 0    I have been so unhappy that I have had difficulty sleeping. 0    I  have felt sad or miserable. 0    I have been so unhappy that I have been crying. 0    The thought of harming myself has occurred to me. 0    Edinburgh Postnatal Depression Scale Total 0          Health Maintenance Due  Topic Date Due   HPV VACCINES (1 - 3-dose series) Never done   Hepatitis B Vaccines 19-59 Average Risk (1 of 3 - 19+ 3-dose series) Never done   Cervical Cancer Screening (Pap smear)  Never done   Influenza Vaccine  12/10/2023   COVID-19 Vaccine (1 - 2025-26 season) Never done    The following portions of the patient's history were reviewed and updated as appropriate: allergies, current medications, past family history, past medical history, past social history, past surgical history, and problem list.  Review of Systems Pertinent items are noted in HPI.  Objective:  BP 96/66   Pulse 76   Ht 5' 2 (1.575 m)   Wt 54 kg   LMP 04/02/2023 (Approximate)   Breastfeeding Yes   BMI 21.77 kg/m    General:  alert, cooperative, and no distress   Breasts:  Deferred  Lungs: Normal respiratory rate and effort  Heart:  Normal rate  Abdomen: Soft, non-tender  Wound C-section incision well healed with full approximated edges  GU exam: Cervix pink, without lesion, scant white creamy discharge, vaginal walls and external genitalia normal       Assessment:   1. Routine postpartum follow-up - Normal postpartum exam - 100w2d s/p primary LTCS with well-healed incision  2. Papanicolaou smear for cervical cancer screening - First Pap today, discussed cervical cancer screening every 3 yrs if normal - Cytology - PAP( [place])  3. Encounter for initial prescription of contraceptive pills (Primary) - Last unprotected intercourse yesterday, UPT neg today, reviewed options for start of Slynd - Drospirenone (SLYND) 4 MG TABS; Take 1 tablet (4 mg total) by mouth daily.  Dispense: 84 tablet; Refill: 3  Plan:   Essential components of care per ACOG recommendations:  1.  Mood  and well being: Patient with negative depression screening today. Reviewed local resources for support.  - Patient tobacco use? No.   - hx of drug use? No.    2. Infant care and feeding:  -Patient currently breastmilk feeding? Yes.  -Social determinants of health (SDOH) reviewed in EPIC. No concerns.  3. Sexuality, contraception and birth spacing - Patient does not want a pregnancy in the next year. Unknown desired pregnancy size, plans for next pregnancy in approx 8 years.  - Reviewed reproductive life planning. Reviewed contraceptive methods based on pt preferences and effectiveness.  Patient desired Oral Contraceptive today.   - Discussed birth spacing of 18 months  4. Sleep and fatigue -Encouraged family/partner/community support of 4 hrs of uninterrupted sleep to help with mood and fatigue  5. Physical Recovery  - Discussed patients delivery and complications. - Patient had a C-section. - Patient has urinary incontinence? No. - Patient is safe to resume physical and sexual activity  6.  Health Maintenance - HM due items addressed Yes - Last pap smear No results found for: DIAGPAP Pap smear done at today's visit.  -Breast Cancer screening indicated? No.   7. Chronic Disease/Pregnancy Condition follow up: None  - PCP follow up  Vernell Ruddle, SNM 02/28/2024 10:41 AM

## 2024-02-28 NOTE — Progress Notes (Deleted)
    Post Partum Visit Note  Sonya Stevens is a 23 y.o. [phone] female who presents for a postpartum visit. She is 6 weeks postpartum following a primary cesarean section.  I have fully reviewed the prenatal and intrapartum course. The delivery was at 37.6 gestational weeks.  Anesthesia: general. Postpartum course has been ***. Baby is doing well***. Baby is feeding by both breast and bottle - Organic Kindermilk. Bleeding no bleeding. Bowel function is normal. Bladder function is normal. Patient is sexually active. Last intercourse was last night. Contraception method is {contraceptive method:5051}.   Postpartum depression screening: negative, score 0.   The pregnancy intention screening data noted above was reviewed. Potential methods of contraception were discussed. The patient elected to proceed with No data recorded.    Health Maintenance Due  Topic Date Due   HPV VACCINES (1 - 3-dose series) Never done   Hepatitis B Vaccines 19-59 Average Risk (1 of 3 - 19+ 3-dose series) Never done   Cervical Cancer Screening (Pap smear)  Never done   Influenza Vaccine  12/10/2023   COVID-19 Vaccine (1 - 2025-26 season) Never done    {Common ambulatory SmartLinks:19316}  Review of Systems {ros; complete:30496}  Objective:  LMP 04/02/2023 (Approximate)    General:  {gen appearance:16600}   Breasts:  {desc; normal/abnormal/not indicated:14647}  Lungs: {lung exam:16931}  Heart:  {heart exam:5510}  Abdomen: {abdomen exam:16834}   Wound {Wound assessment:11097}  GU exam:  {desc; normal/abnormal/not indicated:14647}       Assessment:    There are no diagnoses linked to this encounter.  *** postpartum exam.   Plan:   Essential components of care per ACOG recommendations:  1.  Mood and well being: Patient with {gen negative/positive:315881} depression screening today. Reviewed local resources for support.  - Patient tobacco use? {tobacco use:25506}  - hx of drug use?  {yes/no:25505}    2. Infant care and feeding:  -Patient currently breastmilk feeding? {yes/no:25502}  -Social determinants of health (SDOH) reviewed in EPIC. No concerns***The following needs were identified***  3. Sexuality, contraception and birth spacing - Patient {DOES_DOES WNU:81435} want a pregnancy in the next year.  Desired family size is {NUMBER 1-10:22536} children.  - Reviewed reproductive life planning. Reviewed contraceptive methods based on pt preferences and effectiveness.  Patient desired {Upstream End Methods:24109} today.   - Discussed birth spacing of 18 months  4. Sleep and fatigue -Encouraged family/partner/community support of 4 hrs of uninterrupted sleep to help with mood and fatigue  5. Physical Recovery  - Discussed patients delivery and complications. She describes her labor as {description:25511} - Patient had a {CHL AMB DELIVERY:[id number]}. Patient had a {laceration:25518} laceration. Perineal healing reviewed. Patient expressed understanding - Patient has urinary incontinence? {yes/no:25515} - Patient {ACTION; IS/IS WNU:78978602} safe to resume physical and sexual activity  6.  Health Maintenance - HM due items addressed {Yes or If no, why not?:20788} - Last pap smear No results found for: DIAGPAP Pap smear {done:10129} at today's visit.  -Breast Cancer screening indicated? {indicated:25516}  7. Chronic Disease/Pregnancy Condition follow up: {Follow up:25499}  - PCP follow up  Jerrye Elvie Crumbly, CMA [facility] [employer], [facility]

## 2024-02-28 NOTE — Patient Instructions (Addendum)
 Slynd is a progesterone  only birth control. You can start this as soon as you get it or wait until the first day of your menstrual period to help decrease possibility of irregular bleeding. Some people like to start on Sunday to help remember which day they are on. You will need to use a back up form of birth control (like condoms) for 2 weeks after starting.  If you have been having unprotected intercourse, you should take a pregnancy test before starting. We would recommend checking another pregnancy test at 2 weeks to be sure     At the end of the pill pack there are 4 days of placebo pills where you can expect to have a period. Slynd has a 24 hour missed pill window. If you forget a pill, take it as soon as you remember and resume your normal schedule the next day. If you go longer than 24 hours, you should use a back up method of birth control for 2 weeks.    [facility] Lactation Support Group  Please join us  for our [facility] [employer] [facility] Lact[facility] Group at [employer] [facility] We meet every Tuesday at 10:00 am to 12:00 pm at [employer] on the second floor in the conference room Lactating parents and lap babies are welcome, no registration is required, if you have a lactation pillow please bring

## 2024-02-29 ENCOUNTER — Ambulatory Visit: Payer: Self-pay | Admitting: Obstetrics and Gynecology

## 2024-02-29 LAB — CYTOLOGY - PAP: Diagnosis: NEGATIVE
